# Patient Record
Sex: Female | Born: 1990 | Race: Black or African American | Hispanic: No | Marital: Married | Smoking: Never smoker
Health system: Western US, Academic
[De-identification: ages and names within clinical notes are randomized; demographics above are authoritative.]

## PROBLEM LIST (undated history)

## (undated) DIAGNOSIS — A6000 Herpesviral infection of urogenital system, unspecified: Secondary | ICD-10-CM

## (undated) DIAGNOSIS — N390 Urinary tract infection, site not specified: Secondary | ICD-10-CM

## (undated) DIAGNOSIS — O99012 Anemia complicating pregnancy, second trimester: Secondary | ICD-10-CM

## (undated) HISTORY — DX: Urinary tract infection, site not specified: N39.0

## (undated) HISTORY — DX: Anemia complicating pregnancy, second trimester: O99.012

## (undated) HISTORY — DX: Herpesviral infection of urogenital system, unspecified: A60.00

## (undated) MED ORDER — VALACYCLOVIR HCL 500 MG OR TABS
ORAL_TABLET | ORAL | 1 refills | Status: AC
Start: 2019-03-26 — End: ?

---

## 2015-09-07 HISTORY — PX: OTHER PROCEDURE: U1053

## 2017-09-15 ENCOUNTER — Encounter (INDEPENDENT_AMBULATORY_CARE_PROVIDER_SITE_OTHER): Payer: Self-pay | Admitting: Nurse Practitioner

## 2017-09-15 ENCOUNTER — Other Ambulatory Visit: Payer: TRICARE Prime—HMO | Attending: Nurse Practitioner | Admitting: Nurse Practitioner

## 2017-09-15 VITALS — BP 115/83 | HR 91 | Temp 98.1°F | Resp 16 | Ht 64.0 in | Wt 180.0 lb

## 2017-09-15 DIAGNOSIS — N949 Unspecified condition associated with female genital organs and menstrual cycle: Secondary | ICD-10-CM | POA: Insufficient documentation

## 2017-09-15 DIAGNOSIS — A6000 Herpesviral infection of urogenital system, unspecified: Secondary | ICD-10-CM | POA: Insufficient documentation

## 2017-09-15 DIAGNOSIS — N898 Other specified noninflammatory disorders of vagina: Secondary | ICD-10-CM | POA: Insufficient documentation

## 2017-09-15 DIAGNOSIS — Z3202 Encounter for pregnancy test, result negative: Secondary | ICD-10-CM

## 2017-09-15 DIAGNOSIS — Z113 Encounter for screening for infections with a predominantly sexual mode of transmission: Secondary | ICD-10-CM | POA: Insufficient documentation

## 2017-09-15 DIAGNOSIS — Z1389 Encounter for screening for other disorder: Secondary | ICD-10-CM

## 2017-09-15 LAB — VAGINOSIS SCREEN
Candida, Vaginal Screen: NEGATIVE
Gardnerella, Vaginal Screen: NEGATIVE
Trichomonas, Vaginal Screen: NEGATIVE

## 2017-09-15 LAB — URINE PREGNANCY TEST POCT: HCG, Urine: NEGATIVE

## 2017-09-15 LAB — HIV-ANTIBODY RAPID TEST, BLOOD: HIV 1/2 Rapid Antibody Test: NONREACTIVE

## 2017-09-15 MED ORDER — VALACYCLOVIR HCL 1000 MG OR TABS
1000.0000 mg | ORAL_TABLET | Freq: Two times a day (BID) | ORAL | 0 refills | Status: AC
Start: 2017-09-15 — End: 2017-09-22

## 2017-09-15 NOTE — Interdisciplinary (Signed)
Venipuncture performed per Sue LushAndrea Osorio's,NP order. Lab requisition verified and 1lavender 1 gold tube(s) appropriately obtained. Two patient identifiers verified prior to venipuncture. Blood drawn from right arm with 23 gauge needle. Patient tolerated lab draw well, with no signs of possible adverse reaction. Patient visually verified label on specimen tube for accuracy with nurse. Lab processed along with culture hsv and gc -documented correctly for send out.

## 2017-09-15 NOTE — Interdisciplinary (Signed)
Name/DOB verified.  Pt roomed, VS, and intake done.   Informed on plan of care and aware for any delay; pt verbalized understanding.   Ambulatory to clinic. Comfort measures offered. Vitals stable: Pain addressed.

## 2017-09-15 NOTE — Progress Notes (Signed)
GWENDELYN Vincent  MRN: [id number]  DOB: [birth year]  PMD: No Pcp, Per Patient        Chief Complaint:   Chief Complaint   Patient presents with    Vaginal Discharge     white chucky discharge x 3 weeks ago       HPI:  Autumn Vincent is a 27 year old female  who presents with:  Mild vag itching.    "cut" on vagina 3d ago after rough sex.  Pain when urine runs across skin but not actual dysuria, urinary frequency or urgency.  No vaginal discharge, abdominal pain, nausea, fever or back pain.  Feels well in general.    LMP 08/30/17       Sexual hx:  monogamous with husband only        Past Medical History:   Diagnosis Date    UTI (urinary tract infection)      Past Surgical History:   Procedure Laterality Date    breast reduction  2017    tummy tuck  2017     Social History     Socioeconomic History    Marital status: Married     Spouse name: Not on file    Number of children: Not on file    Years of education: Not on file    Highest education level: Not on file   Social Needs    Financial resource strain: Not on file    Food insecurity - worry: Not on file    Food insecurity - inability: Not on file    Transportation needs - medical: Not on file    Transportation needs - non-medical: Not on file   Occupational History    Not on file   Tobacco Use    Smoking status: Never Smoker    Smokeless tobacco: Never Used   Substance and Sexual Activity    Alcohol use: Yes     Frequency: 2-3 times a week    Drug use: No    Sexual activity: Yes     Partners: Male   Other Topics Concern    Not on file   Social History Narrative    Not on file     Family History   Problem Relation Name Age of Onset    No Known Problems Mother      No Known Problems Father      Hypertension Maternal Grandmother      Cancer Neg Hx      Diabetes Neg Hx      Psychiatry Neg Hx      Heart Disease Neg Hx      Stroke Neg Hx      No Known Cancer Neg Hx      No Known Heart Disease Neg Hx      No Known Diabetes Neg Hx      Thyroid  Neg Hx       Current medications:  None    Review of Systems   Constitutional: No fever, chills, fatigue  ENT:  No sore throat or oral lesions  Gastrointestinal: No abdominal pain, n/v  Genitourinary: See HPI.  No dysuria, urgency, frequency, or flank pain.  Musculoskeletal: No acute muscle or joint pains.  Skin:  No rash or new lesions.  Neurological: No h/a, dizziness  Hematologic/lymphatic:  No swollen or painful LN       Physical Examination:    09/15/17  0953 09/15/17  1013   BP: (!) 125/91 115/83   Pulse:  89 91   Resp: 16    Temp: 98.1 F (36.7 C)    SpO2: 100%      Constitutional:  Well appearing, no distress  Mouth: OP clear, no mucosal lesions  Lungs clear.     Cardiovascular:  RRR   Abdominal:  ND, soft and non-tender.   Back:  No CVAT.     Neuro:  Alert, normal mentation.  Skin: Warm, dry.  Pelvic exam:    Several very small tender ulcerations of introitus.  Scant vaginal d/c.  Cervix closed and without d/c or lesions.  No CMT.  No adnexal tend. or mass.          Diagnostics (if performed):    UPT negative      Impression/Plan:   Genital HSV verses superficial friction avulsions.   Not pregnant.  Will treat presumptively for HSV and culture as well as all other STD screening labs.    -labs as below  -Rx:  7 day course valacyclovir  -Safe sex  -Primary care f/u  -Return precautions      ICD-10-CM ICD-9-CM    1. Vaginal discharge N89.8 623.5 URINE PREGNANCY TEST POCT      Chlamydia/GC PCR, Genital Cobas PCR collection swab      Vaginosis Screen BD Affirm Collection System   2. Vaginal lesion N89.8 623.8 Herpes/Varicella Culture Viral Transport Media Vagina      valACYclovir (VALTREX) 1 GM tablet      HIV-Antibody Rapid Test, Blood Lavender & Yellow Serum Seperator Tube      Syphilis Screen, Blood Yellow serum separator tube   3. Screening examination for venereal disease Z11.3 V74.5 HIV-Antibody Rapid Test, Blood Lavender & Yellow Serum Seperator Tube      Syphilis Screen, Blood Yellow serum separator tube    4. Genital lesion, female N94.9 59629.89 HIV-Antibody Rapid Test, Blood Lavender & Yellow Serum Seperator Tube      Syphilis Screen, Blood Yellow serum separator tube

## 2017-09-15 NOTE — Interdisciplinary (Signed)
Specimens 1 gold sst, 1 lavender, 1 gc swab, 1 viral swab, 1 vaginosis affirm.   Odered at: 12:04pm on: 09/15/2017  Order # [id number]  Picked up by [employer] at 12:34pm on 09/15/2017.   Courier #[id number] , Initails: MR

## 2017-09-15 NOTE — Patient Instructions (Signed)
Sexually Transmitted Disease     You have been diagnosed with or are suspected of having a sexually transmitted disease (STD).     The most common STDs are:  · Gonorrhea. This is also called the "Clap." It is a bacterial infection passed on by sexual intercourse. It may cause a lot of discharge (fluid). It may make urination (peeing) painful. In women, it can cause a lot of vaginal/pelvic pain. It may cause Pelvic Inflammatory Disease (PID). This is a painful condition in women. It can cause the fallopian tubes, which are the tubes that connect the uterus to the ovaries, to scar. It can also cause chronic (ongoing) pelvic pain. It can also cause infertility (the inability to get pregnant). It is treated with antibiotics.  · Chlamydia, also a bacterial infection, may cause a wide range of symptoms. These symptoms range from none to severe pelvic pain and discharge in women and PID, a painful condition in women that can result in scarring of the fallopian tubes (the tubes that connect the uterus to the ovaries). Other symptoms are chronic pelvic pain and infertility (inability to get pregnant). It is treated with antibiotics.  · Trichomoniasis is a protozoan infection that is also called "Trich." It is treated with a special antibiotic. It causes irritating vaginal discharge in women and discharge and penis discomfort in men.  · Herpes Simplex (or just Herpes) is caused by a very infectious virus. It causes painful blisters on the genitals. These tend to burst and scab over. There is no cure for this, but there are medicines to help with symptoms.  · Syphilis is a caused by bacteria. It leaves a painless spot on the penis or vagina. Over time, it may cause a variety of rashes, aches or joint pains. It can also cause fever (temperature higher than 100.4ºF / 38ºC) or other symptoms like neurological injury. Testing is more involved. The health care provider giving the follow-up care after your first visit often does the  tests.  · Hepatitis (Hepatitis B or "Hep B") is a virus. It is passed through sex or sharing dirty needles when using IV drugs. Hep B can affect your liver. Special blood tests are needed for diagnosis. There is no cure. However, there is a vaccine to prevent infection.  · Human Immunodeficiency Virus is the virus that causes AIDS. It is also called "HIV." This disease weakens the natural immune system. It makes people likely to get all types of infection. People get it by being exposed to someone else's infected body fluids during sex. They also get it by sharing needles contaminated with someone else's blood when using IV drugs. People can also get it during a blood transfusion. There are various treatments to stop the progression of this disease. However, there is no cure yet.     We have tested for some of these STDs during your visit today. Your follow-up care will require more testing. The medical staff will inform you of the tests that have been done and where and how to get any more tests that are needed.     The results for most of these tests take several days and are often not ready at the time of your first visit.     The doctor may decide to start treatment for certain diseases right away before all tests results are back.     If the doctor decides to wait for test results before starting treatment, the medical staff will contact you when the test results are ready.  ·   If you have not already given your phone or email contact information to the medical staff, be sure to do so before leaving today.     All efforts will be made to maintain privacy and confidentiality in these matters.     If an STD is suspected or certain, tell your sexual partner(s) of the need to be checked by a doctor or local health department.     Protect yourself and your sexual partner from STDs. Use a condom during intercourse (sex), especially if your diagnosis is unsure and you are waiting for results from pelvic cultures done  during your visit today.     It is OK to go home now.     You may need to return here or go to the nearest Emergency Department if symptoms that might signal complications develop. These include: Worsening infection, bleeding or other problems.     Follow up with the gynecologist or regular doctor in the next few days. Inform your doctor of this visit.  · If you don't have the right follow-up care available, tell the medical staff before you go. The staff can help make arrangements for you.     YOU SHOULD SEEK MEDICAL ATTENTION IMMEDIATELY, EITHER HERE OR AT THE NEAREST EMERGENCY DEPARTMENT, IF ANY OF THE FOLLOWING OCCURS:  · You have worse pain in the abdomen (belly), pelvis or back.  · Increasing amounts of vaginal discharge or bleeding (more than one pad per hour), passing large blood clots (women), or increasing testicular pain and swelling (men).  · Fever (temperature higher than 100.4ºF / 38ºC), chills, nausea (sick to your stomach) or vomiting (throwing up).  · You feel dizzy, lightheaded or pass out.           If you have any follow up questions regarding your Express Care Visit today including symptoms, medication concerns please contact your PCP office who will follow up with you.

## 2017-09-15 NOTE — Interdisciplinary (Signed)
Patient Discharge/Education  AVS printed/explained to pt: Yes  Learner: Patient  Method: Explanation and Handout  Treatment education given: Yes  Response: Verbalizes understanding  Stable upon discharge: Yes  Pleased w/ service and informed of delay: Yes

## 2017-09-16 ENCOUNTER — Telehealth (INDEPENDENT_AMBULATORY_CARE_PROVIDER_SITE_OTHER): Payer: Self-pay | Admitting: Nurse Practitioner

## 2017-09-16 LAB — CHLAMYDIA/GONORRHEA PCR, GENITAL
Chlamydia trachomatis PCR: NOT DETECTED
Neisseria gonorrhoeae PCR: NOT DETECTED

## 2017-09-16 LAB — SYPHILIS EIA SCREEN, BLOOD: Syphilis EIA Screen: NEGATIVE

## 2017-09-16 NOTE — Telephone Encounter (Signed)
Pt called to get test results  HIV, vaginosis screen negative.  Herpes culture, GC chlamydia pending.

## 2017-09-16 NOTE — Telephone Encounter (Signed)
PT called for test results. Please call PT back.

## 2017-09-17 ENCOUNTER — Telehealth (INDEPENDENT_AMBULATORY_CARE_PROVIDER_SITE_OTHER): Payer: Self-pay | Admitting: Occupational Health

## 2017-09-17 NOTE — Progress Notes (Signed)
Patient called and informed of lab results. Herpes culture shows no growth to date. Patient to f/u with PCP on 10/04/17

## 2017-09-17 NOTE — Telephone Encounter (Signed)
See telephone note.

## 2017-09-19 ENCOUNTER — Telehealth (INDEPENDENT_AMBULATORY_CARE_PROVIDER_SITE_OTHER): Payer: Self-pay | Admitting: Nurse Practitioner

## 2017-09-19 NOTE — Telephone Encounter (Signed)
Returned call to pt but no answer and no vm option.

## 2017-09-19 NOTE — Telephone Encounter (Signed)
Patient called for the rest of her results.

## 2017-09-20 ENCOUNTER — Telehealth (INDEPENDENT_AMBULATORY_CARE_PROVIDER_SITE_OTHER): Payer: Self-pay | Admitting: Nurse Practitioner

## 2017-09-20 NOTE — Telephone Encounter (Signed)
Patient called to request her lab results.

## 2017-09-21 ENCOUNTER — Telehealth (INDEPENDENT_AMBULATORY_CARE_PROVIDER_SITE_OTHER): Payer: Self-pay | Admitting: Nurse Practitioner

## 2017-09-21 DIAGNOSIS — A609 Anogenital herpesviral infection, unspecified: Principal | ICD-10-CM

## 2017-09-21 DIAGNOSIS — N898 Other specified noninflammatory disorders of vagina: Secondary | ICD-10-CM

## 2017-09-21 LAB — HERPES/VARICELLA CULTURE

## 2017-09-21 MED ORDER — VALACYCLOVIR HCL 1000 MG OR TABS
1000.0000 mg | ORAL_TABLET | Freq: Two times a day (BID) | ORAL | 1 refills | Status: DC
Start: 2017-09-21 — End: 2018-02-13

## 2017-09-21 NOTE — Telephone Encounter (Signed)
Spoke with pt by phone confirmed (+) hsv, sxs resolved with valtrex, gave refill of same and pt ed on condition/transmission reduction etc f/u prn sxs recur frequently or new sxs develop

## 2017-10-04 ENCOUNTER — Encounter (INDEPENDENT_AMBULATORY_CARE_PROVIDER_SITE_OTHER): Payer: TRICARE Prime—HMO | Admitting: Family Medicine

## 2018-01-03 ENCOUNTER — Encounter (INDEPENDENT_AMBULATORY_CARE_PROVIDER_SITE_OTHER): Payer: Self-pay | Admitting: Family

## 2018-01-03 ENCOUNTER — Ambulatory Visit (INDEPENDENT_AMBULATORY_CARE_PROVIDER_SITE_OTHER): Payer: TRICARE Prime—HMO | Admitting: Family

## 2018-01-03 VITALS — BP 114/71 | HR 62 | Temp 98.1°F | Resp 16 | Ht 64.0 in | Wt 180.0 lb

## 2018-01-03 DIAGNOSIS — Z760 Encounter for issue of repeat prescription: Secondary | ICD-10-CM

## 2018-01-03 MED ORDER — VALACYCLOVIR HCL 500 MG OR TABS
500.0000 mg | ORAL_TABLET | Freq: Two times a day (BID) | ORAL | 2 refills | Status: AC
Start: 2018-01-03 — End: 2018-01-06

## 2018-01-03 NOTE — Interdisciplinary (Signed)
9:30 Name/DOB verified.  Pt roomed, VS, and intake done.   Informed on plan of care and aware for any delay; pt verbalized understanding.   Ambulatory to clinic. Comfort measures offered. Vitals stable: Pain addressed.      9:42 Patient Discharge/Education  AVS printed/explained to pt: Yes  Learner: Patient  Method: Explanation with highlights and Handout  Treatment education given: Yes  Response: Verbalizes understanding  Stable upon discharge: Yes and directed/walked to exit   Pleased w/ service and informed of delay: Yes no complaints

## 2018-01-03 NOTE — Patient Instructions (Addendum)
Herpes Genitalis    Herpes simplex is a common contagious infection caused by a virus. Herpes can infect the mouth and face or the genital area. Genital herpes is a sexually transmitted infection. It passes from person to person during vaginal or anal intercourse (sex). It can also be transmitted during oral sex. Once infected with herpes, you will always have herpes (but may not show symptoms). Herpes sometimes causes painful sores that look like small blisters. Herpes often spreads when someone with these sores has intimate contact (like kissing or sex) with someone without herpes. You are most contagious when you have open sores, but you can still spread it even if you do not have any sores or other symptoms.    Herpes sores develop 2-30 days after exposure to the herpes virus. Other symptoms are tender lymph nodes (lumps in the groin). Sometimes there is fever (temperature higher than 100.39F / 38C), headache or muscle aches. Symptoms usually last 1-2 weeks and get better without treatment. If you get treatment soon enough, symptoms may go away faster.    Antiviral and pain medicines are used in treatment. If used early enough in the course of a herpes outbreak, medicines can shorten the course of the infection and lessen the amount of pain.    Do not have sex until the lesions (sores) fully heal. Use condoms with sex to protect your partner. Even if your herpes rash clears up, you can still be contagious. This means you can spread herpes through sex. If you partner has any symptoms, he or she should see a doctor right away.    YOU SHOULD SEEK MEDICAL ATTENTION IMMEDIATELY, EITHER HERE OR AT THE NEAREST EMERGENCY DEPARTMENT, IF ANY OF THE FOLLOWING OCCURS:   Headache, stiff neck, confusion or light sensitivity develops.   Lesions get more red or painful or look infected. Some signs of infection are redness, swelling, fever (temperature higher than 100.39F / 38C), pain and/or pus drainage.   There is so  much swelling and pain that it is hard to urinate (pee).   For any other new symptoms or concerns.     If you have any follow up questions regarding your Express Care Visit today including symptoms, medication concerns please contact your primary care physician's office who will follow up with you

## 2018-01-03 NOTE — Progress Notes (Signed)
Chief Complaint:   Chief Complaint   Patient presents with    Medication Refill     valtrex         HPI:  Autumn Vincent is a 27 year old female  who presents to clinic for medication refill for herpes.  Patient was diagnosed with genital herpes last January 20 after herpes culture.  Patient has been using Valtrex for flare ups.    Patient states that she would take it for 7 days then will have another flare up after 1 or 2 days.  Patient reports external vaginal itching as usual her symptoms before her flare.  Reports white vaginal discharge that is clumping and not her usual vaginal discharge.  Denies dysuria, urinary frequency, urgency.  She is currently sexually active.  Her 1st primary care provider appointment is on February 09, 2018.    HCM:    FLU - declined today  TDAP - unknown, gave birth 2016.  HPV - unknown  PAP smears - 2016 - normal    Past Medical History:   Diagnosis Date    Genital herpes     UTI (urinary tract infection)      Past Surgical History:   Procedure Laterality Date    breast reduction  2017    tummy tuck  2017     Social History     Socioeconomic History    Marital status: Married     Spouse name: Not on file    Number of children: Not on file    Years of education: Not on file    Highest education level: Not on file   Occupational History    Not on file   Social Needs    Financial resource strain: Not on file    Food insecurity:     Worry: Not on file     Inability: Not on file    Transportation needs:     Medical: Not on file     Non-medical: Not on file   Tobacco Use    Smoking status: Never Smoker    Smokeless tobacco: Never Used   Substance and Sexual Activity    Alcohol use: Yes     Frequency: 2-3 times a week    Drug use: No    Sexual activity: Yes     Partners: Male   Lifestyle    Physical activity:     Days per week: Not on file     Minutes per session: Not on file    Stress: Not on file   Relationships    Social connections:     Talks on phone: Not on file      Gets together: Not on file     Attends religious service: Not on file     Active member of club or organization: Not on file     Attends meetings of clubs or organizations: Not on file     Relationship status: Not on file    Intimate partner violence:     Fear of current or ex partner: Not on file     Emotionally abused: Not on file     Physically abused: Not on file     Forced sexual activity: Not on file   Other Topics Concern    Not on file   Social History Narrative    Not on file     Family History   Problem Relation Name Age of Onset    No Known Problems Mother  No Known Problems Father      Hypertension Maternal Grandmother      Cancer Neg Hx      Diabetes Neg Hx      Psychiatry Neg Hx      Heart Disease Neg Hx      Stroke Neg Hx      No Known Cancer Neg Hx      No Known Heart Disease Neg Hx      No Known Diabetes Neg Hx      Thyroid Neg Hx       Medication Review Moment  Cannot display prior to admission medications because the patient has not been admitted in this contact.       Review of Systems -ROS: Except as documented above, all other systems were reviewed and were negative    Physical Examination:    01/03/18  0700   BP: 114/71   Pulse: 62   Resp: 16   Temp: 98.1 F (36.7 C)   SpO2: 99%     Vital Signs noted from Triage Page.  Autumn Vincent is a well developed well nourished female in no apparent distress. she is alert and oriented x3.   HEENT exam shows:  Eyes PERRL EOMI.    Abdominal exam shows that abdomen is soft and non-tender. No masses or rebound can be palpated.  Back exam has no costovertebral angle tenderness.   Stable gait.   Skin exam shows no lesions or rashes.  Declined pelvic exam   Impression:     27 year old   Female presenting with external vaginal itching requesting Valtrex refill for genital herpes flare up.  Patient reports this is for symptoms should have before flare up.  Has white abnormal vaginal discharge.  Denies UTI symptoms.    Declined visual  inspection external genitalia and declined pelvic exam today.  Patient states she does not have any yeast infection and just wants to have Valtrex refilled.      ICD-10-CM ICD-9-CM    1. Medication refill Z76.0 V68.1 valACYclovir (VALTREX) 500 MG tablet     Plan:    Refilled valtrex 500 mg BID x 3 days, #6, 2 refills.  Pt aware to avoid sex when having a flare up.  May discuss suppressive therapy with PCP.    Return precautions given  Patient to arrange follow up.  Patient comfortable with plan.     RX sent via e-prescription

## 2018-02-07 ENCOUNTER — Ambulatory Visit (INDEPENDENT_AMBULATORY_CARE_PROVIDER_SITE_OTHER): Payer: TRICARE Prime—HMO | Admitting: Family

## 2018-02-07 ENCOUNTER — Encounter (INDEPENDENT_AMBULATORY_CARE_PROVIDER_SITE_OTHER): Payer: Self-pay | Admitting: Family

## 2018-02-07 ENCOUNTER — Telehealth (INDEPENDENT_AMBULATORY_CARE_PROVIDER_SITE_OTHER): Payer: Self-pay | Admitting: Internal Medicine

## 2018-02-07 VITALS — BP 122/85 | HR 88 | Temp 98.2°F | Resp 14 | Ht 64.0 in | Wt 180.0 lb

## 2018-02-07 DIAGNOSIS — Z760 Encounter for issue of repeat prescription: Secondary | ICD-10-CM

## 2018-02-07 MED ORDER — VALACYCLOVIR HCL 500 MG OR TABS
500.00 mg | ORAL_TABLET | Freq: Two times a day (BID) | ORAL | 0 refills | Status: AC
Start: 2018-02-07 — End: 2018-02-10

## 2018-02-07 NOTE — Patient Instructions (Signed)
Herpes Genitalis    Herpes simplex is a common contagious infection caused by a virus. Herpes can infect the mouth and face or the genital area. Genital herpes is a sexually transmitted infection. It passes from person to person during vaginal or anal intercourse (sex). It can also be transmitted during oral sex. Once infected with herpes, you will always have herpes (but may not show symptoms). Herpes sometimes causes painful sores that look like small blisters. Herpes often spreads when someone with these sores has intimate contact (like kissing or sex) with someone without herpes. You are most contagious when you have open sores, but you can still spread it even if you do not have any sores or other symptoms.     Herpes sores develop 2-30 days after exposure to the herpes virus. Other symptoms are tender lymph nodes (lumps in the groin). Sometimes there is fever (temperature higher than 100.4°F / 38°C), headache or muscle aches. Symptoms usually last 1-2 weeks and get better without treatment. If you get treatment soon enough, symptoms may go away faster.     Antiviral and pain medicines are used in treatment. If used early enough in the course of a herpes outbreak, medicines can shorten the course of the infection and lessen the amount of pain.     Do not have sex until the lesions (sores) fully heal. Use condoms with sex to protect your partner. Even if your herpes rash clears up, you can still be contagious. This means you can spread herpes through sex. If you partner has any symptoms, he or she should see a doctor right away.     YOU SHOULD SEEK MEDICAL ATTENTION IMMEDIATELY, EITHER HERE OR AT THE NEAREST EMERGENCY DEPARTMENT, IF ANY OF THE FOLLOWING OCCURS:  · Headache, stiff neck, confusion or light sensitivity develops.  · Lesions get more red or painful or look infected. Some signs of infection are redness, swelling, fever (temperature higher than 100.4°F / 38°C), pain and/or pus drainage.  · There is so  much swelling and pain that it is hard to urinate (pee).  · For any other new symptoms or concerns.

## 2018-02-07 NOTE — Progress Notes (Signed)
Chief Complaint:   Chief Complaint   Patient presents with    Medication Refill     anti-viral medication        HPI:  Autumn Vincent is a [age] year old female  who [facility].    She was diagnosed with herpes genitalis for the first time last January 10 during express care visit.  Pt reports having flare ups every week.    Last seen for med refill April 30. She used Valtrex and lesions would clear up in 3 days. She had 2 recurrence in May. Last dose was May 27-29.    She will be going out of town this week and is worried about having another flare up without any medication.  Denies kidney problems.  She has [facility] to establish care.    Past Medical History:   Diagnosis Date    Genital herpes     UTI (urinary tract infection)      Past Surgical History:   Procedure Laterality Date    breast reduction  2017    tummy tuck  2017     Social History     Socioeconomic History    Marital status: Married     Spouse name: Not on file    Number of children: Not on file    Years of education: Not on file    Highest education level: Not on file   Occupational History    Not on file   Social Needs    Financial resource strain: Not on file    Food insecurity:     Worry: Not on file     Inability: Not on file    Transportation needs:     Medical: Not on file     Non-medical: Not on file   Tobacco Use    Smoking status: Never Smoker    Smokeless tobacco: Never Used   Substance and Sexual Activity    Alcohol use: Yes     Frequency: 2-3 times a week    Drug use: No    Sexual activity: Yes     Partners: Male   Lifestyle    Physical activity:     Days per week: Not on file     Minutes per session: Not on file    Stress: Not on file   Relationships    Social connections:     Talks on phone: Not on file     Gets together: Not on file     Attends religious service: Not on file     Active member of club or organization: Not on file     Attends meetings of  clubs or organizations: Not on file     Relationship status: Not on file    Intimate partner violence:     Fear of current or ex partner: Not on file     Emotionally abused: Not on file     Physically abused: Not on file     Forced sexual activity: Not on file   Other Topics Concern    Not on file   Social History Narrative    Not on file     Family History   Problem Relation Name Age of Onset    No Known Problems Mother      No Known Problems Father      Hypertension Maternal Grandmother      Cancer Neg Hx  Diabetes Neg Hx      Psychiatry Neg Hx      Heart Disease Neg Hx      Stroke Neg Hx      No Known Cancer Neg Hx      No Known Heart Disease Neg Hx      No Known Diabetes Neg Hx      Thyroid Neg Hx       Medication Review Moment  Cannot display prior to admission medications because the patient has not been admitted in this contact.       Review of Systems -ROS: Except as documented above, all other systems were reviewed and were negative    Physical Examination:    02/07/18  1500   BP: 122/85   Pulse: 88   Resp: 14   Temp: 98.2 F (36.8 C)   SpO2: 100%     Vital Signs noted from Triage Page.  Autumn Vincent is a well developed well nourished female in no apparent distress. she is alert and oriented x3.   HEENT exam shows:Marland Kitchen   Eyes PERRL EOMI.    Extremity exam shows no cyanosis, clubbing or edema  Stable gait.     Impression:     27 year old   female here  for medication refill due to frequent herpes flare ups. Currently has no lesions.      ICD-10-CM ICD-9-CM    1. Medication refill Z76.0 V68.1 valACYclovir (VALTREX) 500 MG tablet     Plan:    Prescribed Valtrex 500 mg 2x/day for 3 days for recurrence. #6, no refills.  Return precautions given  To follow up with PCP on Monday.     RX sent via e-prescription

## 2018-02-07 NOTE — Telephone Encounter (Signed)
New Patient Pre-Visit Planning    Future Appointments   Date Time Provider Department Center   02/13/2018 11:00 AM Iva LentoBach, Clark T, MD [facility]       Mychart offered: Yes      Agenda Setting:    1) overall check up   2) est care    Preferred Pharmacy:     Send to:     [id number]- [place], [place] - [address] [facility] [address] [facility] [address]  [address]  [place] [id number]  Phone: [phone] Fax: [phone]        Previous Medical Provider/Facility:    Name/Facility: n/a   Street Address: n/a   Phone number: n/a   Fax number: n/a

## 2018-02-08 NOTE — Telephone Encounter (Signed)
Information entered in chart and in future note

## 2018-02-09 ENCOUNTER — Ambulatory Visit (INDEPENDENT_AMBULATORY_CARE_PROVIDER_SITE_OTHER): Payer: TRICARE Prime—HMO | Admitting: Internal Medicine

## 2018-02-13 ENCOUNTER — Ambulatory Visit (INDEPENDENT_AMBULATORY_CARE_PROVIDER_SITE_OTHER): Payer: TRICARE Prime—HMO | Admitting: Internal Medicine

## 2018-02-13 ENCOUNTER — Encounter (INDEPENDENT_AMBULATORY_CARE_PROVIDER_SITE_OTHER): Payer: Self-pay

## 2018-02-13 ENCOUNTER — Encounter

## 2018-02-13 ENCOUNTER — Encounter (INDEPENDENT_AMBULATORY_CARE_PROVIDER_SITE_OTHER): Payer: Self-pay | Admitting: Internal Medicine

## 2018-02-13 ENCOUNTER — Other Ambulatory Visit: Payer: Self-pay

## 2018-02-13 VITALS — BP 116/74 | HR 88 | Temp 98.4°F | Ht 63.5 in | Wt 182.5 lb

## 2018-02-13 DIAGNOSIS — O99012 Anemia complicating pregnancy, second trimester: Secondary | ICD-10-CM | POA: Insufficient documentation

## 2018-02-13 DIAGNOSIS — Z1389 Encounter for screening for other disorder: Secondary | ICD-10-CM

## 2018-02-13 DIAGNOSIS — Z7689 Persons encountering health services in other specified circumstances: Secondary | ICD-10-CM

## 2018-02-13 DIAGNOSIS — Z8619 Personal history of other infectious and parasitic diseases: Secondary | ICD-10-CM

## 2018-02-13 DIAGNOSIS — Z1339 Encounter for screening examination for other mental health and behavioral disorders: Secondary | ICD-10-CM

## 2018-02-13 DIAGNOSIS — Z1331 Encounter for screening for depression: Secondary | ICD-10-CM

## 2018-02-13 HISTORY — DX: Anemia complicating pregnancy, second trimester: O99.012

## 2018-02-13 MED ORDER — VALACYCLOVIR HCL 500 MG OR TABS
ORAL_TABLET | ORAL | Status: DC
Start: 2018-01-03 — End: 2018-02-13

## 2018-02-13 MED ORDER — CEPHALEXIN 500 MG OR CAPS
ORAL_CAPSULE | ORAL | Status: DC
Start: 2016-12-27 — End: 2018-02-13

## 2018-02-13 MED ORDER — PROMETHAZINE HCL 25 MG RE SUPP
RECTAL | Status: DC
Start: 2016-12-27 — End: 2018-02-13

## 2018-02-13 MED ORDER — VALACYCLOVIR HCL 500 MG OR TABS
500.00 mg | ORAL_TABLET | Freq: Every day | ORAL | 1 refills | Status: DC
Start: 2018-02-13 — End: 2018-04-13

## 2018-02-13 MED ORDER — VALACYCLOVIR HCL 500 MG OR TABS
ORAL_TABLET | ORAL | 0 refills | Status: DC
Start: 2018-02-10 — End: 2018-02-13

## 2018-02-13 NOTE — Patient Instructions (Addendum)
1. Any new itching or rash, take pic or come in, take antiviral med    HPV (Human Papillomavirus) Vaccine: What You Need to Know  1. Why get vaccinated?  HPV vaccine prevents infection with human papillomavirus (HPV) types that are associated with many cancers, including:   cervical cancer in females,   vaginal and vulvar cancers in females,   anal cancer in females and males,   throat cancer in females and males, and   penile cancer in males.    In addition, HPV vaccine prevents infection with HPV types that cause genital warts in both females and males.  In the [place] about 12,000 women get cervical cancer every year, and about 4,000 women die from it. HPV vaccine can prevent most of these cases of cervical cancer.  Vaccination is not a substitute for cervical cancer screening. This vaccine does not protect against all HPV types that can cause cervical cancer. Women should still get regular Pap tests.  HPV infection usually comes from sexual contact, and most people will become infected at some point in their life. About 14 million Americans, including teens, get infected every year. Most infections will go away on their own and not cause serious problems. But thousands of women and men get cancer and other diseases from HPV.  2. HPV vaccine  HPV vaccine is approved by FDA and is recommended by CDC for both males and females. It is routinely given at 7111 or 25 years of age, but it may be given beginning at age 219 years through age 25 years.  Most adolescents 9 through 25 years of age should get HPV vaccine as a two-dose series with the doses separated by 6-12 months. People who start HPV vaccination at 25 years of age and older should get the vaccine as a three-dose series with the second dose given 1-2 months after the first dose and the third dose given 6 months after the first dose. There are several exceptions to these age recommendations. Your health care provider can give you more information.  3. Some  people should not get this vaccine   Anyone who has had a severe (life-threatening) allergic reaction to a dose of HPV vaccine should not get another dose.   Anyone who has a severe (life threatening) allergy to any component of HPV vaccine should not get the vaccine.   Tell your doctor if you have any severe allergies that you know of, including a severe allergy to yeast.   HPV vaccine is not recommended for pregnant women. If you learn that you were pregnant when you were vaccinated, there is no reason to expect any problems for you or your baby. Any woman who learns she was pregnant when she got HPV vaccine is encouraged to contact the manufacturer's registry for HPV vaccination during pregnancy at [phone]. Women who are breastfeeding may be vaccinated.   If you have a mild illness, such as a cold, you can probably get the vaccine today. If you are moderately or severely ill, you should probably wait until you recover. Your doctor can advise you.  4. Risks of a vaccine reaction  With any medicine, including vaccines, there is a chance of side effects. These are usually mild and go away on their own, but serious reactions are also possible.  Most people who get HPV vaccine do not have any serious problems with it.  Mild or moderate problems following HPV vaccine:   Reactions in the arm where the shot was  given:  ? Soreness (about 9 people in 10)  ? Redness or swelling (about 1 person in 3)   Fever:  ? Mild (100F) (about 1 person in 10)  ? Moderate (102F) (about 1 person in 3)   Other problems:  ? Headache (about 1 person in 3)  Problems that could happen after any injected vaccine:   People sometimes faint after a medical procedure, including vaccination. Sitting or lying down for about 15 minutes can help prevent fainting, and injuries caused by a fall. Tell your doctor if you feel dizzy, or have vision changes or ringing in the ears.   Some people get severe pain in the shoulder and have  difficulty moving the arm where a shot was given. This happens very rarely.   Any medication can cause a severe allergic reaction. Such reactions from a vaccine are very rare, estimated at about 1 in a million doses, and would happen within a few minutes to a few hours after the vaccination.  As with any medicine, there is a very remote chance of a vaccine causing a serious injury or death.  The safety of vaccines is always being monitored. For more information, visit: http://floyd.org/.  5. What if there is a serious reaction?  What should I look for?  Look for anything that concerns you, such as signs of a severe allergic reaction, very high fever, or unusual behavior.  Signs of a severe allergic reaction can include hives, swelling of the face and throat, difficulty breathing, a fast heartbeat, dizziness, and weakness. These would usually start a few minutes to a few hours after the vaccination.  What should I do?  If you think it is a severe allergic reaction or other emergency that can't wait, call 9-1-1 or get to the nearest hospital. Otherwise, call your doctor.  Afterward, the reaction should be reported to the Vaccine Adverse Event Reporting System (VAERS). Your doctor should file this report, or you can do it yourself through the VAERS web site at www.vaers.LAgents.no, or by calling 1-416-654-9409.  VAERS does not give medical advice.  6. The National Vaccine Injury Compensation Program  The [employer] Vaccine Injury Compensation Program (VICP) is a federal program that was created to compensate people who may have been injured by certain vaccines.  Persons who believe they may have been injured by a vaccine can learn about the program and about filing a claim by calling 1-484-103-7144 or visiting the VICP website at SpiritualWord.at. There is a time limit to file a claim for compensation.  7. How can I learn more?   Ask your health care provider. He or she can give you the vaccine  package insert or suggest other sources of information.   Call your local or state health department.   Contact the Centers for Disease Control and Prevention (CDC):  ? Call (908)299-3860 (1-800-CDC-INFO) or  ? Visit CDCs website at RunningConvention.de  Vaccine Information Statement, HPV Vaccine (08/08/2015)  This information is not intended to replace advice given to you by your health care provider. Make sure you discuss any questions you have with your health care provider.  Document Released: 03/20/2014 Document Revised: 05/13/2016 Document Reviewed: 05/13/2016  Elsevier Interactive Patient Education  2017 [employer].

## 2018-02-13 NOTE — Progress Notes (Signed)
CC:   Chief Complaint   Patient presents with   • Establish Care   • Refill Request       Subjective:    Autumn Vincent is a 27 year old female   who presents for Establish Care and Refill Request     27yo F Jan 2019 dx genital herpes and was rx valacyclovir intermittently, was having sx every week now 2x a mo, has taken valacyclovir that's been effective w in 3d. Her first episode had blisters, but since then her episodes are not painful but itchy and Labia swollen, but she doesn't see vesicles when taking valacyclovir. No current active lesions. Her LMP was may 17.    Pt admit to used to drink 3-4x a mo, last drink was on vacation on sat, but doesn't think she has drinking problem, and is declining counseling.      Patient Active Problem List   Diagnosis   • Anemia complicating pregnancy, second trimester       Review of Systems   Constitutional: Negative for chills, diaphoresis, fever, malaise/fatigue and weight loss.   HENT: Negative for congestion, ear discharge, ear pain, hearing loss, nosebleeds, sinus pain, sore throat and tinnitus.    Eyes: Negative for blurred vision, double vision, photophobia, pain, discharge and redness.   Respiratory: Negative for cough, hemoptysis, sputum production, shortness of breath, wheezing and stridor.    Cardiovascular: Negative for chest pain, palpitations, orthopnea, claudication, leg swelling and PND.   Gastrointestinal: Negative for abdominal pain, blood in stool, constipation, diarrhea, heartburn, melena, nausea and vomiting.   Genitourinary: Negative for dysuria, flank pain, frequency, hematuria and urgency.   Musculoskeletal: Negative for back pain, falls, joint pain, myalgias and neck pain.   Skin: Negative for itching and rash.   Neurological: Negative for dizziness, tingling, tremors, sensory change, speech change, focal weakness, seizures, loss of consciousness, weakness and headaches.   Endo/Heme/Allergies: Negative for environmental allergies and  polydipsia. Does not bruise/bleed easily.   Psychiatric/Behavioral: Negative for depression, hallucinations, memory loss, substance abuse and suicidal ideas. The patient is not nervous/anxious and does not have insomnia.          Current Outpatient Medications on File Prior to Visit   Medication Sig Dispense Refill   • [DISCONTINUED] cephALEXin (KEFLEX) 500 MG capsule      • [DISCONTINUED] promethazine (PHENERGAN) 25 MG suppository      • [DISCONTINUED] valACYclovir (VALTREX) 1 GM tablet Take 1 tablet (1,000 mg) by mouth 2 times daily. 20 tablet 1   • [DISCONTINUED] valACYclovir (VALTREX) 500 MG tablet      • [DISCONTINUED] valACYclovir (VALTREX) 500 MG tablet   0     No current facility-administered medications on file prior to visit.        No Known Allergies    Patient Active Problem List   Diagnosis   • Anemia complicating pregnancy, second trimester       Past Medical History:   Diagnosis Date   • Anemia complicating pregnancy, second trimester 02/13/2018   • Genital herpes    • UTI (urinary tract infection)        Past Surgical History:   Procedure Laterality Date   • breast reduction  2017   • tummy tuck  2017       Social History     Socioeconomic History   • Marital status: Married     Spouse name: Not on file   • Number of children: Not on file   •   Years of education: Not on file   • Highest education level: Not on file   Occupational History   • Not on file   Social Needs   • Financial resource strain: Not on file   • Food insecurity:     Worry: Not on file     Inability: Not on file   • Transportation needs:     Medical: Not on file     Non-medical: Not on file   Tobacco Use   • Smoking status: Never Smoker   • Smokeless tobacco: Never Used   Substance and Sexual Activity   • Alcohol use: Yes     Frequency: 2-3 times a week   • Drug use: No   • Sexual activity: Yes     Partners: Male   Lifestyle   • Physical activity:     Days per week: Not on file     Minutes per session: Not on file   • Stress: Not on  file   Relationships   • Social connections:     Talks on phone: Not on file     Gets together: Not on file     Attends religious service: Not on file     Active member of club or organization: Not on file     Attends meetings of clubs or organizations: Not on file     Relationship status: Not on file   • Intimate partner violence:     Fear of current or ex partner: Not on file     Emotionally abused: Not on file     Physically abused: Not on file     Forced sexual activity: Not on file   Other Topics Concern   • Not on file   Social History Narrative   • Not on file       Family Status   Relation Status   • Mo Alive   • Fa Alive   • MGMo (Not Specified)   • Neg Hx (Not Specified)         Objective:  BP 116/74 (BP Location: Left arm, BP Patient Position: Sitting, BP cuff size: Large)    Pulse 88    Temp 98.4 °F (36.9 °C) (Oral)    Ht 5' 3.5" (1.613 m)    Wt 82.8 kg (182 lb 8 oz)    LMP 01/20/2018 (Exact Date)    SpO2 99%    BMI 31.82 kg/m²  Body mass index is 31.82 kg/m².      Gen: in NAD, A&O, pleasant, non-toxic appearing  HEENT: NC/AT, No icterus, ptosis. Oropharynx w/out exudates or erythema. Bilateral ear canal and TM's normal.   Neck: Supple, no LAD.  Lungs: clear to auscultation bilaterally. No chest deformities noted.  No wheeze/rales/rhonchi   CV: regular rate & rhythm, nl S1, S2. No murmurs  Abdomen: Soft, + bowel sounds.  Non-tender, non-distended. No masses, organomegaly. No rebound tenderness.  Extremities: No edema.   Neurologic: Mentation appropriate. 5/5 Strength in bilateral upper and lower extremities,sensation intact.   GU: declined PE, denies lesions    Routine health care maintenance were dealt with today. Appropriate screening labs and studies were ordered, and preventative counseling was done.    Health Maintenance   Topic Date Due   • CERVICAL CANCER SCREENING  07/01/2014   • HPV Vaccine <= 26 Yrs (1) 02/17/2018 (Originally 07/01/2004)   • INFLUENZA VACCINE  04/06/2018   • CHLAMYDIA SCREENING   09/15/2018   •   PHQ2 depression screen  02/14/2019   • IMM_TD/TDAP=>11 YO  08/27/2024         Lipids:  No results found for: CHOL, HDL, LDLCALC, TRIG, LDLDIRECT    Diabetes screen:No results found for: A1C  Thyroid:  No results found for: TSH      No results found for: COLORUA, APPEARUA, GLUCOSEUA, BILIUA, KETONEUA, SGUA, BLOODUA, PHUA, PROTEINUA, UROBILUA, NITRITEUA, LEUKESTUA, WBCUA, RBCUA, EPITHCELLSUA, HYALINEUA, CRYSTALSUA, COMMENTSUA    No results found for: WBC, RBC, HGB, HCT, MCV, MCHC, RDW, PLT, MPV  No results found for: BUN, CREAT, CL, NA, K, [place], TBILI, ALB, TP, AST, ALK, BICARB, ALT, GLU        ASSESSMENT:/PLAN:    History of herpes genitalis  - for suppression tx: valACYclovir (VALTREX) 500 MG tablet; Take 1 tablet (500 mg) by mouth daily.  Dispense: 30 tablet; Refill: 1    Screened positive for possible risky alcohol use (AUDIT score of 7-15)  CAGE Questions for Alcohol Use from MDCalc.com  on 02/13/2018    RESULT SUMMARY:  0 points  Screening negative.      INPUTS:  Have you ever felt you needed to Cut down on your drinking? --> 0 = No  Have people Annoyed you by criticizing your drinking? --> 0 = No  Have you ever felt Guilty about drinking? --> 0 = No  Have you ever felt you needed a drink first thing in the morning (Eye-opener) to steady your nerves or to get rid of a hangover? --> 0 = No    - Counseled for alcohol use  - declined further help      Screened negative for depression  Screened negative for drug use    Establishing care with new doctor, encounter for            Problem List Items Addressed This Visit     None      Visit Diagnoses     History of herpes genitalis    -  Primary    Relevant Medications    valACYclovir (VALTREX) 500 MG tablet    Screened positive for possible risky alcohol use (AUDIT score of 7-15)        Relevant Orders    Counseled for alcohol use (Completed)    Screened negative for depression        Screened negative for drug use        Establishing care with new doctor,  encounter for              Follow up    Return in 1 month (on 03/15/2018) for PAP, f/u AUDIT at follow up visit.    No future appointments.        Medication Review:  Medications reviewed with patient and medication list reconciled.  Over the counter medications, herbal therapies and supplements reviewed.  Patient's understanding and response to medications assessed.   Barriers to medications assessed and addressed.   Risks, benefits, alternatives to medications reviewed.      Patient Instruction: See Patient Education Section    No barriers to learning, verbalizes understanding of teaching and instructions.    Clark Bach, MD  [facility]          Reviewed AUDIT score (Total Score: 8) and discussed options with patient and employed shared-decision making in developing follow-up plan.

## 2018-02-27 ENCOUNTER — Encounter (INDEPENDENT_AMBULATORY_CARE_PROVIDER_SITE_OTHER): Payer: Self-pay

## 2018-02-27 ENCOUNTER — Telehealth (INDEPENDENT_AMBULATORY_CARE_PROVIDER_SITE_OTHER): Payer: Self-pay | Admitting: Internal Medicine

## 2018-02-27 NOTE — Telephone Encounter (Signed)
-----   Message from Janalyn Shyyasia S. Muhlbauer sent at 02/26/2018 11:34 AM PDT -----  Regarding: Appointment Request  Contact: [phone]  Appointment Request From: Annia Beltyasia Sierra Gosdin    With Provider: Iva Lentolark T Bach, MD [facility]]    Preferred Date Range: 02/27/2018  02/28/2018    Preferred Times: Any time    Reason for visit: Symptoms    Comments:  Personal

## 2018-02-27 NOTE — Telephone Encounter (Signed)
Please see below. Patient is requesting an urgent appointment due to symptoms but does not list what kind of symptoms she is having.

## 2018-02-27 NOTE — Telephone Encounter (Signed)
Called patient; left message to call back.

## 2018-02-27 NOTE — Telephone Encounter (Signed)
1st attempt- tried calling patient to find out what symptoms she is having. Left message to call back.     Mychart message sent also.

## 2018-02-28 NOTE — Telephone Encounter (Signed)
Called patient no answer left message to call back.

## 2018-03-15 ENCOUNTER — Encounter (INDEPENDENT_AMBULATORY_CARE_PROVIDER_SITE_OTHER): Payer: TRICARE Prime—HMO | Admitting: Internal Medicine

## 2018-04-13 ENCOUNTER — Encounter (INDEPENDENT_AMBULATORY_CARE_PROVIDER_SITE_OTHER): Payer: Self-pay | Admitting: Internal Medicine

## 2018-04-13 ENCOUNTER — Other Ambulatory Visit: Payer: TRICARE Prime—HMO | Attending: Internal Medicine | Admitting: Internal Medicine

## 2018-04-13 VITALS — BP 133/87 | HR 74 | Temp 99.0°F | Resp 17 | Ht 63.5 in | Wt 181.0 lb

## 2018-04-13 DIAGNOSIS — E669 Obesity, unspecified: Secondary | ICD-10-CM | POA: Insufficient documentation

## 2018-04-13 DIAGNOSIS — Z23 Encounter for immunization: Secondary | ICD-10-CM

## 2018-04-13 DIAGNOSIS — Z6831 Body mass index (BMI) 31.0-31.9, adult: Secondary | ICD-10-CM

## 2018-04-13 DIAGNOSIS — Z0000 Encounter for general adult medical examination without abnormal findings: Principal | ICD-10-CM | POA: Insufficient documentation

## 2018-04-13 DIAGNOSIS — Z8619 Personal history of other infectious and parasitic diseases: Secondary | ICD-10-CM

## 2018-04-13 DIAGNOSIS — Z124 Encounter for screening for malignant neoplasm of cervix: Secondary | ICD-10-CM | POA: Insufficient documentation

## 2018-04-13 MED ORDER — VALACYCLOVIR HCL 500 MG OR TABS
500.0000 mg | ORAL_TABLET | Freq: Every day | ORAL | 1 refills | Status: DC
Start: 2018-04-15 — End: 2018-06-29

## 2018-04-13 NOTE — Interdisciplinary (Signed)
Verified immunizations prior to administration for Autumn Gonzalez, MA.

## 2018-04-13 NOTE — Progress Notes (Signed)
CC:   Chief Complaint   Patient presents with   . Physical       Subjective:    Autumn Vincent is a 27 year old female   who presents for Physical     26yo F Jan 2019 dx genital herpes controlled on valacyclovir presents for above CC for PAP.    Patient Active Problem List   Diagnosis   . Anemia complicating pregnancy, second trimester   . History of herpes genitalis   . Screened positive for possible risky alcohol use (AUDIT score of 7-15)   . Obesity (BMI 30.0-34.9)       Review of Systems   Constitutional: Negative for chills, diaphoresis, fever, malaise/fatigue and weight loss.   HENT: Negative for congestion, ear discharge, ear pain, hearing loss, nosebleeds, sinus pain, sore throat and tinnitus.    Eyes: Negative for blurred vision, double vision, photophobia, pain, discharge and redness.   Respiratory: Negative for cough, hemoptysis, sputum production, shortness of breath, wheezing and stridor.    Cardiovascular: Negative for chest pain, palpitations, orthopnea, claudication, leg swelling and PND.   Gastrointestinal: Negative for abdominal pain, blood in stool, constipation, diarrhea, heartburn, melena, nausea and vomiting.   Genitourinary: Negative for dysuria, flank pain, frequency, hematuria and urgency.   Musculoskeletal: Negative for back pain, falls, joint pain, myalgias and neck pain.   Skin: Negative for itching and rash.   Neurological: Negative for dizziness, tingling, tremors, sensory change, speech change, focal weakness, seizures, loss of consciousness, weakness and headaches.   Endo/Heme/Allergies: Negative for environmental allergies and polydipsia. Does not bruise/bleed easily.   Psychiatric/Behavioral: Negative for depression, hallucinations, memory loss, substance abuse and suicidal ideas. The patient is not nervous/anxious and does not have insomnia.          Current Outpatient Medications on File Prior to Visit   Medication Sig Dispense Refill   . [DISCONTINUED] valACYclovir  (VALTREX) 500 MG tablet Take 1 tablet (500 mg) by mouth daily. 30 tablet 1     No current facility-administered medications on file prior to visit.        No Known Allergies    Patient Active Problem List   Diagnosis   . Anemia complicating pregnancy, second trimester   . History of herpes genitalis   . Screened positive for possible risky alcohol use (AUDIT score of 7-15)   . Obesity (BMI 30.0-34.9)       Past Medical History:   Diagnosis Date   . Anemia complicating pregnancy, second trimester 02/13/2018   . Genital herpes    . UTI (urinary tract infection)        Past Surgical History:   Procedure Laterality Date   . breast reduction  2017   . tummy tuck  2017       Social History     Socioeconomic History   . Marital status: Married     Spouse name: Not on file   . Number of children: Not on file   . Years of education: Not on file   . Highest education level: Not on file   Occupational History   . Not on file   Social Needs   . Financial resource strain: Not on file   . Food insecurity:     Worry: Not on file     Inability: Not on file   . Transportation needs:     Medical: Not on file     Non-medical: Not on file   Tobacco Use   .  Smoking status: Never Smoker   . Smokeless tobacco: Never Used   Substance and Sexual Activity   . Alcohol use: Yes     Frequency: 2-3 times a week   . Drug use: No   . Sexual activity: Yes     Partners: Male   Lifestyle   . Physical activity:     Days per week: Not on file     Minutes per session: Not on file   . Stress: Not on file   Relationships   . Social connections:     Talks on phone: Not on file     Gets together: Not on file     Attends religious service: Not on file     Active member of club or organization: Not on file     Attends meetings of clubs or organizations: Not on file     Relationship status: Not on file   . Intimate partner violence:     Fear of current or ex partner: Not on file     Emotionally abused: Not on file     Physically abused: Not on file     Forced  sexual activity: Not on file   Other Topics Concern   . Not on file   Social History Narrative   . Not on file       Family Status   Relation Status   . Mo Alive   . Fa Alive   . [facility] (Not Specified)   . Neg Hx (Not Specified)         Objective:  BP 133/87 (BP Location: Right arm, BP Patient Position: Sitting, BP cuff size: Regular)   Pulse 74   Temp 99 F (37.2 C) (Oral)   Resp 17   Ht 5' 3.5" (1.613 m)   Wt 82.1 kg (181 lb)   LMP 03/16/2018 (Exact Date)   BMI 31.56 kg/m  Body mass index is 31.56 kg/m.      Gen: in NAD, A&O, pleasant, non-toxic appearing  HEENT: NC/AT, No icterus, ptosis. Oropharynx w/out exudates or erythema. Bilateral ear canal and TM's normal.   Neck: Supple, no LAD.  Lungs: clear to auscultation bilaterally. No chest deformities noted.  No wheeze/rales/rhonchi   CV: regular rate & rhythm, nl S1, S2. No murmurs  Abdomen: Soft, + bowel sounds.  Non-tender, non-distended. No masses, organomegaly. No rebound tenderness.  Extremities: No edema.   Neurologic: Mentation appropriate. 5/5 Strength in bilateral upper and lower extremities,sensation intact.   GU: speculum exam cervix visible w whitish mucus, no lesions seen  Breasts: no lumps masses or TTP    Routine health care maintenance were dealt with today. Appropriate screening labs and studies were ordered, and preventative counseling was done.    Health Maintenance   Topic Date Due   . HPV Vaccine <= 26 Yrs (1) 07/01/2004   . INFLUENZA VACCINE  06/06/2018   . CHLAMYDIA SCREENING  09/15/2018   . PHQ2 depression screen  02/14/2019   . CERVICAL CANCER SCREENING  04/13/2021   . IMM_TD/TDAP=>11 YO  08/27/2024         Lipids:  No results found for: CHOL, HDL, LDLCALC, TRIG, LDLDIRECT    Diabetes screen:No results found for: A1C  Thyroid:  No results found for: TSH      No results found for: COLORUA, APPEARUA, GLUCOSEUA, BILIUA, KETONEUA, SGUA, BLOODUA, PHUA, PROTEINUA, UROBILUA, NITRITEUA, LEUKESTUA, WBCUA, RBCUA, EPITHCELLSUA, HYALINEUA,  CRYSTALSUA, COMMENTSUA    No results found for: WBC, RBC,  HGB, HCT, MCV, MCHC, RDW, PLT, MPV  No results found for: BUN, CREAT, CL, NA, K, [place], TBILI, ALB, TP, AST, ALK, BICARB, ALT, GLU        ASSESSMENT:/PLAN:    Physical exam  Cervical cancer screening  - Cytopath Gyn (Pap Smear)    History of herpes genitalis  - refill: valACYclovir (VALTREX) 500 MG tablet; Take 1 tablet (500 mg) by mouth daily.  Dispense: 30 tablet; Refill: 1    Obesity (BMI 30.0-34.9)  - Consult/Referral to Integrative Nutrition with RD  - simple plate diet given to pt    Need for vaccination  - Gardasil-9 HPV Vaccine 3-Dose  (Admin now)  - Gardasil-9 HPV Vaccine 3-Dose  (Admin in 2 mon); Future  - Gardasil-9 HPV Vaccine 3-Dose (Admin in 6 mon); Future      Problem List Items Addressed This Visit        Other    History of herpes genitalis    Relevant Medications    valACYclovir (VALTREX) 500 MG tablet (Start on 04/15/2018)    Obesity (BMI 30.0-34.9)    Relevant Orders    Consult/Referral to Integrative Nutrition with RD      Other Visit Diagnoses     Physical exam    -  Primary    Relevant Orders    Cytopath Gyn (Pap Smear) (Completed)    Cervical cancer screening        Relevant Orders    Cytopath Gyn (Pap Smear) (Completed)    Need for vaccination        Relevant Orders    Gardasil-9 HPV Vaccine 3-Dose  (Admin now) (Completed)    Gardasil-9 HPV Vaccine 3-Dose  (Admin in 2 mon)    Gardasil-9 HPV Vaccine 3-Dose (Admin in 6 mon)          Follow up    Return if symptoms worsen or fail to improve.    No future appointments.        Medication Review:  Medications reviewed with patient and medication list reconciled.  Over the counter medications, herbal therapies and supplements reviewed.  Patient's understanding and response to medications assessed.   Barriers to medications assessed and addressed.   Risks, benefits, alternatives to medications reviewed.      Patient Instruction: See Patient Education Section    No barriers to learning, verbalizes  understanding of teaching and instructions.    Clyde Canterbury, MD  [facility]

## 2018-04-14 ENCOUNTER — Telehealth (INDEPENDENT_AMBULATORY_CARE_PROVIDER_SITE_OTHER): Payer: Self-pay | Admitting: Internal Medicine

## 2018-04-14 NOTE — Telephone Encounter (Signed)
Emailed pt about results

## 2018-04-20 ENCOUNTER — Encounter (INDEPENDENT_AMBULATORY_CARE_PROVIDER_SITE_OTHER): Payer: Self-pay | Admitting: Internal Medicine

## 2018-04-20 ENCOUNTER — Telehealth (INDEPENDENT_AMBULATORY_CARE_PROVIDER_SITE_OTHER): Payer: Self-pay | Admitting: Internal Medicine

## 2018-04-20 LAB — HPV HIGH RISK DNA PROBE, FEMALE
HPV High Risk Genotype 16: NOT DETECTED
HPV High Risk Genotype 18: NOT DETECTED
HPV Other High Risk Genotypes (Not type 16 or 18): NOT DETECTED

## 2018-04-20 NOTE — Telephone Encounter (Signed)
Emailed pt about results

## 2018-05-06 ENCOUNTER — Encounter (INDEPENDENT_AMBULATORY_CARE_PROVIDER_SITE_OTHER): Payer: TRICARE Prime—HMO

## 2018-05-07 ENCOUNTER — Encounter (INDEPENDENT_AMBULATORY_CARE_PROVIDER_SITE_OTHER): Payer: TRICARE Prime—HMO | Admitting: Occupational Health

## 2018-05-11 ENCOUNTER — Telehealth (INDEPENDENT_AMBULATORY_CARE_PROVIDER_SITE_OTHER): Payer: Self-pay | Admitting: Internal Medicine

## 2018-05-11 NOTE — Telephone Encounter (Signed)
-----   Message from Janalyn Shy sent at 05/11/2018  9:09 AM PDT -----  Regarding: Appointment Request (HM)  Contact: [phone]  Appointment Request From: Annia Belt    With Provider: Iva Lento, MD [-Primary Care Physician-]    Preferred Date Range: Any date 05/11/2018 or later    Preferred Times: Any    Reason: To address the following health maintenance concerns.  Hpv Vaccine <= 26 Yrs    Comments:  Any appointment

## 2018-05-11 NOTE — Telephone Encounter (Signed)
Yes please schedule RN appt for 2nd HPV vaccine.

## 2018-05-11 NOTE — Telephone Encounter (Signed)
Please advise if necessary orders for patient to receive the HPV vaccine are entered and I will schedule nurse visit.

## 2018-05-12 ENCOUNTER — Encounter (INDEPENDENT_AMBULATORY_CARE_PROVIDER_SITE_OTHER): Payer: TRICARE Prime—HMO

## 2018-05-24 ENCOUNTER — Encounter (INDEPENDENT_AMBULATORY_CARE_PROVIDER_SITE_OTHER): Payer: Self-pay

## 2018-05-24 ENCOUNTER — Ambulatory Visit (INDEPENDENT_AMBULATORY_CARE_PROVIDER_SITE_OTHER): Payer: TRICARE Prime—HMO

## 2018-05-24 DIAGNOSIS — Z23 Encounter for immunization: Principal | ICD-10-CM

## 2018-05-24 NOTE — Interdisciplinary (Signed)
Gave patient gardasil shot #2 per MD immunization order. Patient verified with 2 IDs and allergies reviewed. See immunization record for admin details. Pt tolerated injection. Pt declined flu vaccine today.    Pt also reports she is travelling on a 7 day cruise to [place], [place], and [place]. Her grandmother told her to look into the need for travel vaccines. Advised patient we do not have a travel clinic in this dept and since cruise starts this Saturday it would be hard for her to get into [place] travel clinic in time. RN reviewed CDC and her vaccine history with her, advised we may be able to offer some of the recommended vaccines for those locations such as rabies but the others such as typhoid and yellow fever, we do not carry. She declines the the offer to ask Dr. Toni ArthursBach to order some travel vaccines tpday. Recommended she contact other travel clinics in the local areas if wants to review with a travel specialist, recommend [facility] or [facility]. RN also reviewed need for bug spray with deet to help avoid insect bites. Pt should also bring hand sanitizer and avoid ice and water that is not bottled. Pt aware I don't know all of the precautions and in order to be safe during her upcoming travel, recommend she review CDC for other travel precautions and recommendations before she leaves on her trip.  Verbalizes understanding and agrees with plan of care.     Pt left clinic ambulatory in no apparent distress.

## 2018-06-29 ENCOUNTER — Other Ambulatory Visit (INDEPENDENT_AMBULATORY_CARE_PROVIDER_SITE_OTHER): Payer: Self-pay | Admitting: Internal Medicine

## 2018-06-29 DIAGNOSIS — Z8619 Personal history of other infectious and parasitic diseases: Secondary | ICD-10-CM

## 2018-06-29 MED ORDER — VALACYCLOVIR HCL 500 MG OR TABS
500.0000 mg | ORAL_TABLET | Freq: Every day | ORAL | 1 refills | Status: DC
Start: 2018-06-29 — End: 2018-07-26

## 2018-06-29 MED ORDER — VALACYCLOVIR HCL 500 MG OR TABS
ORAL_TABLET | ORAL | 0 refills | Status: DC
Start: 2018-06-29 — End: 2018-06-29

## 2018-06-29 NOTE — Telephone Encounter (Signed)
rx refilled.

## 2018-06-29 NOTE — Telephone Encounter (Signed)
Last refill 04/15/2018 qty 30 with 1 refill

## 2018-07-26 ENCOUNTER — Other Ambulatory Visit (INDEPENDENT_AMBULATORY_CARE_PROVIDER_SITE_OTHER): Payer: Self-pay | Admitting: Internal Medicine

## 2018-07-26 DIAGNOSIS — Z8619 Personal history of other infectious and parasitic diseases: Secondary | ICD-10-CM

## 2018-07-26 MED ORDER — VALACYCLOVIR HCL 500 MG OR TABS
500.00 mg | ORAL_TABLET | Freq: Every day | ORAL | 1 refills | Status: DC
Start: 2018-07-26 — End: 2019-03-25

## 2018-07-26 NOTE — Telephone Encounter (Signed)
rx refilled.

## 2018-09-20 ENCOUNTER — Encounter (INDEPENDENT_AMBULATORY_CARE_PROVIDER_SITE_OTHER): Payer: Self-pay | Admitting: Internal Medicine

## 2018-09-22 NOTE — Telephone Encounter (Signed)
Please contact pt to make exam for possible gyn exam

## 2018-10-02 ENCOUNTER — Encounter (INDEPENDENT_AMBULATORY_CARE_PROVIDER_SITE_OTHER): Payer: Self-pay | Admitting: Internal Medicine

## 2018-10-02 ENCOUNTER — Encounter (INDEPENDENT_AMBULATORY_CARE_PROVIDER_SITE_OTHER): Payer: TRICARE Prime—HMO | Admitting: Internal Medicine

## 2018-10-03 ENCOUNTER — Other Ambulatory Visit: Payer: TRICARE Prime—HMO | Attending: Internal Medicine | Admitting: Internal Medicine

## 2018-10-03 ENCOUNTER — Encounter (INDEPENDENT_AMBULATORY_CARE_PROVIDER_SITE_OTHER): Payer: TRICARE Prime—HMO | Admitting: Internal Medicine

## 2018-10-03 ENCOUNTER — Telehealth (INDEPENDENT_AMBULATORY_CARE_PROVIDER_SITE_OTHER): Payer: Self-pay | Admitting: Internal Medicine

## 2018-10-03 ENCOUNTER — Encounter (INDEPENDENT_AMBULATORY_CARE_PROVIDER_SITE_OTHER): Payer: Self-pay | Admitting: Internal Medicine

## 2018-10-03 VITALS — BP 121/65 | HR 104 | Temp 98.8°F | Resp 16 | Ht 63.5 in | Wt 185.0 lb

## 2018-10-03 DIAGNOSIS — B3731 Acute candidiasis of vulva and vagina: Secondary | ICD-10-CM

## 2018-10-03 DIAGNOSIS — N898 Other specified noninflammatory disorders of vagina: Secondary | ICD-10-CM | POA: Insufficient documentation

## 2018-10-03 DIAGNOSIS — B373 Candidiasis of vulva and vagina: Secondary | ICD-10-CM

## 2018-10-03 DIAGNOSIS — B9689 Other specified bacterial agents as the cause of diseases classified elsewhere: Secondary | ICD-10-CM | POA: Insufficient documentation

## 2018-10-03 LAB — VAGINOSIS SCREEN
Candida, Vaginal Screen: POSITIVE — AB
Gardnerella, Vaginal Screen: POSITIVE — AB
Trichomonas, Vaginal Screen: NEGATIVE

## 2018-10-03 MED ORDER — METRONIDAZOLE 500 MG OR TABS
500.0000 mg | ORAL_TABLET | Freq: Two times a day (BID) | ORAL | 0 refills | Status: DC
Start: 2018-10-03 — End: 2018-11-07

## 2018-10-03 MED ORDER — FLUCONAZOLE 150 MG OR TABS
150.00 mg | ORAL_TABLET | Freq: Once | ORAL | 0 refills | Status: AC
Start: 2018-10-03 — End: 2018-10-03

## 2018-10-03 NOTE — Telephone Encounter (Signed)
Emailed pt about results

## 2018-10-03 NOTE — Patient Instructions (Signed)
Vaginal Yeast infection, Adult    Vaginal yeast infection is a condition that causes vaginal discharge as well as soreness, swelling, and redness (inflammation) of the vagina. This is a common condition. Some women get this infection frequently.  What are the causes?  This condition is caused by a change in the normal balance of the yeast (candida) and bacteria that live in the vagina. This change causes an overgrowth of yeast, which causes the inflammation.  What increases the risk?  The condition is more likely to develop in women who:   Take antibiotic medicines.   Have diabetes.   Take birth control pills.   Are pregnant.   Douche often.   Have a weak body defense system (immune system).   Have been taking steroid medicines for a long time.   Frequently wear tight clothing.  What are the signs or symptoms?  Symptoms of this condition include:   White, thick, creamy vaginal discharge.   Swelling, itching, redness, and irritation of the vagina. The lips of the vagina (vulva) may be affected as well.   Pain or a burning feeling while urinating.   Pain during sex.  How is this diagnosed?  This condition is diagnosed based on:   Your medical history.   A physical exam.   A pelvic exam. Your health care provider will examine a sample of your vaginal discharge under a microscope. Your health care provider may send this sample for testing to confirm the diagnosis.  How is this treated?  This condition is treated with medicine. Medicines may be over-the-counter or prescription. You may be told to use one or more of the following:   Medicine that is taken by mouth (orally).   Medicine that is applied as a cream (topically).   Medicine that is inserted directly into the vagina (suppository).  Follow these instructions at home:    Lifestyle   Do not have sex until your health care provider approves. Tell your sex partner that you have a yeast infection. That person should go to his or her health care  provider and ask if they should also be treated.   Do not wear tight clothes, such as pantyhose or tight pants.   Wear breathable cotton underwear.  General instructions   Take or apply over-the-counter and prescription medicines only as told by your health care provider.   Eat more yogurt. This may help to keep your yeast infection from returning.   Do not use tampons until your health care provider approves.   Try taking a sitz bath to help with discomfort. This is a warm water bath that is taken while you are sitting down. The water should only come up to your hips and should cover your buttocks. Do this 3-4 times per day or as told by your health care provider.   Do not douche.   If you have diabetes, keep your blood sugar levels under control.   Keep all follow-up visits as told by your health care provider. This is important.  Contact a health care provider if:   You have a fever.   Your symptoms go away and then return.   Your symptoms do not get better with treatment.   Your symptoms get worse.   You have new symptoms.   You develop blisters in or around your vagina.   You have blood coming from your vagina and it is not your menstrual period.   You develop pain in your abdomen.  Summary     Vaginal yeast infection is a condition that causes discharge as well as soreness, swelling, and redness (inflammation) of the vagina.   This condition is treated with medicine. Medicines may be over-the-counter or prescription.   Take or apply over-the-counter and prescription medicines only as told by your health care provider.   Do not douche. Do not have sex or use tampons until your health care provider approves.   Contact a health care provider if your symptoms do not get better with treatment or your symptoms go away and then return.  This information is not intended to replace advice given to you by your health care provider. Make sure you discuss any questions you have with your health care  provider.  Document Released: 06/02/2005 Document Revised: 01/09/2018 Document Reviewed: 01/09/2018  Elsevier Interactive Patient Education  2019 [employer]

## 2018-10-03 NOTE — Progress Notes (Signed)
CC:   Chief Complaint   Patient presents with   . Gyn Exam       Subjective:    Autumn Vincent is a 28 year old female   who presents for Gyn Exam     Pt presents for vaginal dc and itching, assoc when she is late or misses valtrex dose. Here for gyn exam.    Patient Active Problem List   Diagnosis   . Anemia complicating pregnancy, second trimester   . History of herpes genitalis   . Screened positive for possible risky alcohol use (AUDIT score of 7-15)   . Obesity (BMI 30.0-34.9)       Review of Systems   Constitutional: Negative for chills, diaphoresis, fever, malaise/fatigue and weight loss.   HENT: Negative for congestion, ear discharge, ear pain, hearing loss, nosebleeds, sinus pain, sore throat and tinnitus.    Eyes: Negative for blurred vision, double vision, photophobia, pain, discharge and redness.   Respiratory: Negative for cough, hemoptysis, sputum production, shortness of breath, wheezing and stridor.    Cardiovascular: Negative for chest pain, palpitations, orthopnea, claudication, leg swelling and PND.   Gastrointestinal: Negative for abdominal pain, blood in stool, constipation, diarrhea, heartburn, melena, nausea and vomiting.   Genitourinary: Negative for dysuria, flank pain, frequency, hematuria and urgency.        Vag dc and itching   Musculoskeletal: Negative for back pain, falls, joint pain, myalgias and neck pain.   Skin: Negative for itching and rash.   Neurological: Negative for dizziness, tingling, tremors, sensory change, speech change, focal weakness, seizures, loss of consciousness, weakness and headaches.   Endo/Heme/Allergies: Negative for environmental allergies and polydipsia. Does not bruise/bleed easily.   Psychiatric/Behavioral: Negative for depression, hallucinations, memory loss, substance abuse and suicidal ideas. The patient is not nervous/anxious and does not have insomnia.          Current Outpatient Medications on File Prior to Visit   Medication Sig Dispense  Refill   . valACYclovir (VALTREX) 500 MG tablet Take 1 tablet (500 mg) by mouth daily. 30 tablet 1     No current facility-administered medications on file prior to visit.        No Known Allergies    Patient Active Problem List   Diagnosis   . Anemia complicating pregnancy, second trimester   . History of herpes genitalis   . Screened positive for possible risky alcohol use (AUDIT score of 7-15)   . Obesity (BMI 30.0-34.9)       Past Medical History:   Diagnosis Date   . Anemia complicating pregnancy, second trimester 02/13/2018   . Genital herpes    . UTI (urinary tract infection)        Past Surgical History:   Procedure Laterality Date   . breast reduction  2017   . tummy tuck  2017       Social History     Socioeconomic History   . Marital status: Married     Spouse name: Not on file   . Number of children: Not on file   . Years of education: Not on file   . Highest education level: Not on file   Occupational History   . Not on file   Social Needs   . Financial resource strain: Not on file   . Food insecurity:     Worry: Not on file     Inability: Not on file   . Transportation needs:     Medical:  Not on file     Non-medical: Not on file   Tobacco Use   . Smoking status: Never Smoker   . Smokeless tobacco: Never Used   Substance and Sexual Activity   . Alcohol use: Yes     Frequency: 2-3 times a week   . Drug use: No   . Sexual activity: Yes     Partners: Male   Lifestyle   . Physical activity:     Days per week: Not on file     Minutes per session: Not on file   . Stress: Not on file   Relationships   . Social connections:     Talks on phone: Not on file     Gets together: Not on file     Attends religious service: Not on file     Active member of club or organization: Not on file     Attends meetings of clubs or organizations: Not on file     Relationship status: Not on file   . Intimate partner violence:     Fear of current or ex partner: Not on file     Emotionally abused: Not on file     Physically abused:  Not on file     Forced sexual activity: Not on file   Other Topics Concern   . Not on file   Social History Narrative   . Not on file       Family Status   Relation Status   . Mo Alive   . Fa Alive   . [facility] (Not Specified)   . Neg Hx (Not Specified)         Objective:  BP 121/65 (BP Location: Left arm, BP Patient Position: Sitting, BP cuff size: Regular)   Pulse 104   Temp 98.8 F (37.1 C) (Oral)   Resp 16   Ht 5' 3.5" (1.613 m)   Wt 83.9 kg (185 lb)   LMP 09/12/2018 (Exact Date)   BMI 32.26 kg/m  Body mass index is 32.26 kg/m.      Gen: in NAD, A&O, pleasant, non-toxic appearing  HEENT: NC/AT, No icterus, ptosis.   Neck: Supple  Lungs: clear to auscultation bilaterally. No chest deformities noted.  No wheeze/rales/rhonchi   CV: regular rate & rhythm, nl S1, S2. No murmurs  Abdomen: obses, Soft, + bowel sounds.  Non-tender,   Extremities: No edema.   Neurologic: Mentation appropriate. 5/5 Strength in bilateral upper and lower extremities, sensation intact.   GU: cottage cheese like dc and within vag vault    Routine health care maintenance were dealt with today. Appropriate screening labs and studies were ordered, and preventative counseling was done.    Health Maintenance   Topic Date Due   . INFLUENZA VACCINE  04/06/2018   . HPV Vaccine <= 26 Yrs (3 - Female 3-dose series) 10/14/2018   . PHQ2 depression screen  02/14/2019   . CERVICAL CANCER SCREENING  04/13/2021   . Tetanus (8 - Td) 08/27/2024   . Polio Vaccine  Aged Out   . Meningococcal MCV4 Vaccine  Aged Out         Lipids:  No results found for: CHOL, HDL, LDLCALC, TRIG, LDLDIRECT    Diabetes screen:No results found for: A1C  Thyroid:  No results found for: TSH        No results found for: COLORUA, APPEARUA, [place], BILIUA, KETONEUA, [place], [place], [place], [place], UROBILUA, NITRITEUA, LEUKESTUA, [place], RBCUA, EPITHCELLSUA, [place], CRYSTALSUA, COMMENTSUA  No results found for: WBC, RBC, HGB, HCT, MCV, MCHC, RDW, PLT, MPV  No results found  for: BUN, CREAT, CL, NA, K, [place], TBILI, ALB, TP, AST, ALK, BICARB, ALT, GLU        ASSESSMENT:/PLAN:    Vaginal discharge  Vaginal itching  Vaginal yeast infection  - Vaginosis Screen BD Affirm Collection System  - Chlamydia/GC PCR, Genital Cobas PCR collection swab Cervix  - fluCONazole (DIFLUCAN) 150 MG tablet; Take 1 tablet (150 mg) by mouth once for 1 dose.  Dispense: 1 tablet; Refill: 0      Problem List Items Addressed This Visit     None      Visit Diagnoses     Vaginal discharge    -  Primary    Relevant Medications    fluCONazole (DIFLUCAN) 150 MG tablet    Other Relevant Orders    Vaginosis Screen BD Affirm Collection System    Chlamydia/GC PCR, Genital Cobas PCR collection swab Cervix    Vaginal itching        Relevant Medications    fluCONazole (DIFLUCAN) 150 MG tablet    Other Relevant Orders    Vaginosis Screen BD Affirm Collection System    Chlamydia/GC PCR, Genital Cobas PCR collection swab Cervix    Vaginal yeast infection        Relevant Medications    fluCONazole (DIFLUCAN) 150 MG tablet    Other Relevant Orders    Vaginosis Screen BD Affirm Collection System    Chlamydia/GC PCR, Genital Cobas PCR collection swab Cervix          Follow up    Return if symptoms worsen or fail to improve.    No future appointments.        Medication Review:  Medications reviewed with patient and medication list reconciled.  Over the counter medications, herbal therapies and supplements reviewed.  Patient's understanding and response to medications assessed.   Barriers to medications assessed and addressed.   Risks, benefits, alternatives to medications reviewed.      Patient Instruction: See Patient Education Section    No barriers to learning, verbalizes understanding of teaching and instructions.    Clyde Canterbury, MD  [facility]

## 2018-10-04 ENCOUNTER — Encounter (INDEPENDENT_AMBULATORY_CARE_PROVIDER_SITE_OTHER): Payer: Self-pay | Admitting: Internal Medicine

## 2018-10-04 LAB — CHLAMYDIA/GONORRHEA PCR, GENITAL
Chlamydia trachomatis PCR: NOT DETECTED
Neisseria gonorrhoeae PCR: NOT DETECTED

## 2018-11-06 ENCOUNTER — Encounter (INDEPENDENT_AMBULATORY_CARE_PROVIDER_SITE_OTHER): Payer: Self-pay | Admitting: Internal Medicine

## 2018-11-06 DIAGNOSIS — B379 Candidiasis, unspecified: Principal | ICD-10-CM

## 2018-11-06 DIAGNOSIS — N760 Acute vaginitis: Secondary | ICD-10-CM

## 2018-11-06 DIAGNOSIS — B9689 Other specified bacterial agents as the cause of diseases classified elsewhere: Secondary | ICD-10-CM

## 2018-11-07 ENCOUNTER — Encounter (INDEPENDENT_AMBULATORY_CARE_PROVIDER_SITE_OTHER): Payer: Self-pay | Admitting: Internal Medicine

## 2018-11-07 MED ORDER — FLUCONAZOLE 150 MG OR TABS
150.0000 mg | ORAL_TABLET | Freq: Once | ORAL | 0 refills | Status: AC
Start: 2018-11-07 — End: 2018-11-07

## 2018-11-07 MED ORDER — METRONIDAZOLE 500 MG OR TABS
500.0000 mg | ORAL_TABLET | Freq: Two times a day (BID) | ORAL | 0 refills | Status: DC
Start: 2018-11-07 — End: 2024-05-16

## 2018-12-29 ENCOUNTER — Encounter (INDEPENDENT_AMBULATORY_CARE_PROVIDER_SITE_OTHER): Payer: Self-pay | Admitting: Internal Medicine

## 2018-12-29 DIAGNOSIS — B373 Candidiasis of vulva and vagina: Secondary | ICD-10-CM

## 2018-12-29 DIAGNOSIS — B3731 Acute candidiasis of vulva and vagina: Secondary | ICD-10-CM

## 2018-12-29 DIAGNOSIS — B379 Candidiasis, unspecified: Principal | ICD-10-CM

## 2018-12-29 DIAGNOSIS — N898 Other specified noninflammatory disorders of vagina: Secondary | ICD-10-CM

## 2018-12-29 MED ORDER — FLUCONAZOLE 150 MG OR TABS
150.0000 mg | ORAL_TABLET | Freq: Once | ORAL | 0 refills | Status: AC
Start: 2018-12-29 — End: 2018-12-29

## 2018-12-29 NOTE — Telephone Encounter (Signed)
Please contact pt to make clinic appt for exam and swab for labs

## 2019-01-05 NOTE — Telephone Encounter (Signed)
With PRIMARY CARE Iva Lento, MD)  01/12/2019 at 9:40 AM

## 2019-01-12 ENCOUNTER — Encounter (INDEPENDENT_AMBULATORY_CARE_PROVIDER_SITE_OTHER): Payer: TRICARE Prime—HMO | Admitting: Internal Medicine

## 2019-01-15 ENCOUNTER — Ambulatory Visit (INDEPENDENT_AMBULATORY_CARE_PROVIDER_SITE_OTHER): Payer: TRICARE Prime—HMO | Admitting: Internal Medicine

## 2019-01-15 VITALS — BP 115/76 | HR 85 | Temp 97.9°F | Wt 189.8 lb

## 2019-01-15 DIAGNOSIS — Z8619 Personal history of other infectious and parasitic diseases: Secondary | ICD-10-CM

## 2019-01-15 DIAGNOSIS — Z23 Encounter for immunization: Secondary | ICD-10-CM

## 2019-01-15 DIAGNOSIS — N760 Acute vaginitis: Secondary | ICD-10-CM

## 2019-01-15 DIAGNOSIS — B9689 Other specified bacterial agents as the cause of diseases classified elsewhere: Secondary | ICD-10-CM

## 2019-01-15 DIAGNOSIS — Z09 Encounter for follow-up examination after completed treatment for conditions other than malignant neoplasm: Secondary | ICD-10-CM

## 2019-01-15 MED ORDER — HAIR SKIN AND NAILS FORMULA PO: ORAL | Status: AC

## 2019-01-15 NOTE — Progress Notes (Signed)
CC:   Chief Complaint   Patient presents with   . Follow up Results       Subjective:    Autumn Vincent is a 28 year old female   who presents for Follow up Results         In jan 2020 pt had vaginal dc and itching, assoc when she is late or misses valtrex dose, was found to have yeast and BV infection, completed fluconazole and metronidazole.    Then mar 2020 had repeat sx, but only completed fluconazole and didn't take metronidazole.    Then Apr 24 pt writes:  "  Hello, I'm not sure why I'm having recurring Yeast Infections. Should I get tested again? My symptoms are back... can I come in after work at 6?   ...  The same as before, itchy and thick white discharge. Ever since I was first diagnosed with yeast infection in comes back almost every 2 months. Same symptoms.   "    Pt was rx and completed fluconazole, today w no sx.    Is open to seeing OBGYN for recurring sx.    Patient Active Problem List   Diagnosis   . Anemia complicating pregnancy, second trimester   . History of herpes genitalis   . Screened positive for possible risky alcohol use (AUDIT score of 7-15)   . Obesity (BMI 30.0-34.9)   . BV (bacterial vaginosis)       Review of Systems   Constitutional: Negative for chills, diaphoresis, fever, malaise/fatigue and weight loss.   HENT: Negative for congestion, ear discharge, ear pain, hearing loss, nosebleeds, sinus pain, sore throat and tinnitus.    Eyes: Negative for blurred vision, double vision, photophobia, pain, discharge and redness.   Respiratory: Negative for cough, hemoptysis, sputum production, shortness of breath, wheezing and stridor.    Cardiovascular: Negative for chest pain, palpitations, orthopnea, claudication, leg swelling and PND.   Gastrointestinal: Negative for abdominal pain, blood in stool, constipation, diarrhea, heartburn, melena, nausea and vomiting.   Genitourinary: Negative for dysuria, flank pain, frequency, hematuria and urgency.   Musculoskeletal: Negative for  back pain, falls, joint pain, myalgias and neck pain.   Skin: Negative for itching and rash.   Neurological: Negative for dizziness, tingling, tremors, sensory change, speech change, focal weakness, seizures, loss of consciousness, weakness and headaches.   Endo/Heme/Allergies: Negative for environmental allergies and polydipsia. Does not bruise/bleed easily.   Psychiatric/Behavioral: Negative for depression, hallucinations, memory loss, substance abuse and suicidal ideas. The patient is not nervous/anxious and does not have insomnia.          Current Outpatient Medications on File Prior to Visit   Medication Sig Dispense Refill   . metroNIDAZOLE (FLAGYL) 500 MG tablet Take 1 tablet (500 mg) by mouth 2 times daily (with meals). 14 tablet 0   . Multiple Vitamins-Minerals (HAIR SKIN AND NAILS FORMULA PO)      . valACYclovir (VALTREX) 500 MG tablet Take 1 tablet (500 mg) by mouth daily. 30 tablet 1     No current facility-administered medications on file prior to visit.        No Known Allergies    Patient Active Problem List   Diagnosis   . Anemia complicating pregnancy, second trimester   . History of herpes genitalis   . Screened positive for possible risky alcohol use (AUDIT score of 7-15)   . Obesity (BMI 30.0-34.9)   . BV (bacterial vaginosis)       Past  Medical History:   Diagnosis Date   . Anemia complicating pregnancy, second trimester 02/13/2018   . Genital herpes    . UTI (urinary tract infection)        Past Surgical History:   Procedure Laterality Date   . breast reduction  2017   . tummy tuck  2017       Social History     Socioeconomic History   . Marital status: Married     Spouse name: Not on file   . Number of children: Not on file   . Years of education: Not on file   . Highest education level: Not on file   Occupational History   . Not on file   Social Needs   . Financial resource strain: Not on file   . Food insecurity:     Worry: Not on file     Inability: Not on file   . Transportation needs:      Medical: Not on file     Non-medical: Not on file   Tobacco Use   . Smoking status: Never Smoker   . Smokeless tobacco: Never Used   Substance and Sexual Activity   . Alcohol use: Yes     Frequency: 2-3 times a week   . Drug use: No   . Sexual activity: Yes     Partners: Male   Lifestyle   . Physical activity:     Days per week: Not on file     Minutes per session: Not on file   . Stress: Not on file   Relationships   . Social connections:     Talks on phone: Not on file     Gets together: Not on file     Attends religious service: Not on file     Active member of club or organization: Not on file     Attends meetings of clubs or organizations: Not on file     Relationship status: Not on file   . Intimate partner violence:     Fear of current or ex partner: Not on file     Emotionally abused: Not on file     Physically abused: Not on file     Forced sexual activity: Not on file   Other Topics Concern   . Not on file   Social History Narrative   . Not on file       Family Status   Relation Status   . Mo Alive   . Fa Alive   . [facility] (Not Specified)   . Neg Hx (Not Specified)         Objective:  BP 115/76 (BP Location: Left arm, BP Patient Position: Sitting, BP cuff size: Regular)   Pulse 85   Temp 97.9 F (36.6 C) (Oral)   Wt 86.1 kg (189 lb 12.8 oz)   LMP 01/04/2019   SpO2 98%   BMI 33.09 kg/m  Body mass index is 33.09 kg/m.      Gen: in NAD, A&O, pleasant, non-toxic appearing  HEENT: NC/AT, No icterus, ptosis.   Neck: Supple  Lungs: clear to auscultation bilaterally. No chest deformities noted.  No wheeze/rales/rhonchi   CV: regular rate & rhythm, nl S1, S2. No murmurs  Abdomen: obese, Soft, + bowel sounds.  Non-tender,   Extremities: No edema.   Neurologic: Mentation appropriate. 5/5 Strength in bilateral upper and lower extremities, sensation intact.     Routine health care maintenance were dealt with today.  Appropriate screening labs and studies were ordered, and preventative counseling was  done.    Health Maintenance   Topic Date Due   . HPV Vaccine <= 26 Yrs (3 - Female 3-dose series) 10/14/2018   . PHQ2 depression screen  02/14/2019   . INFLUENZA VACCINE  04/07/2019   . CERVICAL CANCER SCREENING  04/13/2021   . Tetanus (8 - Td) 08/27/2024   . Polio Vaccine  Completed   . Meningococcal MCV4 Vaccine  Aged Out         Lipids:  No results found for: CHOL, HDL, LDLCALC, TRIG, LDLDIRECT    Diabetes screen:No results found for: A1C  Thyroid:  No results found for: TSH        No results found for: COLORUA, APPEARUA, GLUCOSEUA, BILIUA, KETONEUA, SGUA, BLOODUA, PHUA, PROTEINUA, UROBILUA, NITRITEUA, LEUKESTUA, [place], RBCUA, EPITHCELLSUA, HYALINEUA, CRYSTALSUA, COMMENTSUA    No results found for: WBC, RBC, HGB, HCT, MCV, MCHC, RDW, PLT, MPV  No results found for: BUN, CREAT, CL, NA, K, [place], TBILI, ALB, TP, AST, ALK, BICARB, ALT, GLU        ASSESSMENT:/PLAN:  hx 2019 genital herpes on rx, when missed rx thinks assoc w yeast infection, 3x yeast infection in last year, 1x BV, would like to see [facility]    History of candidal vulvovaginitis  BV (bacterial vaginosis)  - [facility]    Need for vaccination  - HPV 9-Valent (GARDASIL-9) Vaccine    Follow up      Problem List Items Addressed This Visit        Reproductive    BV (bacterial vaginosis)    Relevant Orders    [facility]      Other Visit Diagnoses     History of candidal vulvovaginitis    -  Primary    Relevant Orders    [facility] Clinic    Need for vaccination        Relevant Orders    HPV 9-Valent (GARDASIL-9) Vaccine (Completed)    Follow up              Follow up    Return if symptoms worsen or fail to improve.    No future appointments.        Medication Review:  Medications reviewed with patient and medication list reconciled.  Over the counter medications, herbal therapies and supplements reviewed.  Patient's understanding and response to medications assessed.   Barriers to medications  assessed and addressed.   Risks, benefits, alternatives to medications reviewed.      Patient Instruction: See Patient Education Section    No barriers to learning, verbalizes understanding of teaching and instructions.    Clyde Canterbury, MD  [facility]

## 2019-01-25 ENCOUNTER — Other Ambulatory Visit: Payer: TRICARE Prime—HMO | Attending: Nurse Practitioner | Admitting: Nurse Practitioner

## 2019-01-25 ENCOUNTER — Encounter (INDEPENDENT_AMBULATORY_CARE_PROVIDER_SITE_OTHER): Payer: Self-pay | Admitting: Nurse Practitioner

## 2019-01-25 VITALS — BP 91/68 | HR 88 | Temp 97.7°F | Ht 63.5 in | Wt 188.9 lb

## 2019-01-25 DIAGNOSIS — N898 Other specified noninflammatory disorders of vagina: Secondary | ICD-10-CM | POA: Insufficient documentation

## 2019-01-25 DIAGNOSIS — Z8742 Personal history of other diseases of the female genital tract: Secondary | ICD-10-CM

## 2019-01-25 LAB — VAGINOSIS SCREEN
Candida, Vaginal Screen: POSITIVE — AB
Gardnerella, Vaginal Screen: NEGATIVE
Trichomonas, Vaginal Screen: NEGATIVE

## 2019-01-25 NOTE — Patient Instructions (Signed)
Nice to meet you!    I will contact you tomorrow with your results.      Vulvar Care and Suggestions to Aid in Vulvar Pain and Itching   Careful care of the vulva can help alleviate some symptoms of itching, burning and irritation since the vulvar skin can be quite sensitive. The inner vulva is moist and has very thin skin, so may be more susceptible to injury. There are various strategies that can be used to support the vulva. Below is a list of techniques to help you with itching and pain.   1. Avoid contact with irritating chemicals: this includes any product that has soaps, perfumes and detergents like fabric softener, deodorant soap, bubble bath, feminine hygiene spray, scented or deodorized menstrual products and scented bath products. We do not endorse the use of Vagisil benzocaine, or over-the-counter products used for "feminine hygiene and itching." These contain potential irritants.   2. Wear 100% cotton underwear: enclosing the vulva in synthetic products holds both heat and moisture, conditions that worsen many vulvar conditions. Avoid use of panty hose, thongs, tights or other close fitting clothes for similar reasons. If you need to wear panty hose, cut the crotch out of the nylons to reduce the clinginess to the body.   3. Do not over-clean your vulva and rinse the skin off with plain water: use tap water only. The vulva is not dirty. You can also use sitz (shallow) baths, squirt bottles or bidets to aid in your hygiene. Pat the skin gently dry or dry with a cool hair dryer, but do not over dry since this can deprive the vulva of necessary moisture and oils. Rinse the vulva last after shampooing and washing as this will eliminate any residual soap or shampoos on the vulva. Overzealous washing (more than once a day) can lead to dryness, skin irritation, and susceptibility to inflammation. Do you not use scrub brushes, wash clothes or buffers on the vulva. You should wash gently with your hands  only.   4. Use mild soap: If you prefer to use soap on the vulva, use a mild one such as Neutrogena, Basis, Pears (made in [place]), Cetaphil (non-fragranced) or castile soap with olive oil. These are found at most pharmacies or health food stores. Remember that frequent use of soap will remove natural skin oils and may cause itching. It may be best to avoid soaps altogether and just rinse with plain water.   5. Try a compress of Aveeno: Aveeno is an anti-irritant that comes in many forms. You can try a compress made with the powdered oatmeal bath treatment. Place 2 tablespoons of Aveeno in 1-2 pints of water. Mix in a jar and refrigerate. You can apply this to the vulva 3-4 times a day as needed. Some find it helpful after intercourse when symptoms can typically flare.   6. Try cool wet tea bags: There is some therapeutic effect from tannins in tea. Wet plain black tea bags applied to inflamed tissue can be soothing. The wet tea bags should be applied directly to the skin and may be held in place with a menstrual pad.   7. Always use lubricants with sexual activity: Couples using barrier contraception (condoms or diaphragm) should use water-based or silicone-based lubricants. Water-based lubricants include as Astroglide, FemGlide, Wet, or Slippery Stuff. Two silicone-based lubricants include PJUR, Eros, and Pure Romance. Avoid the use of K-Y Jelly, which can become tacky and sticky. If you are not using latex barriers (condoms or   diaphragm) as contraception, you may try oil-based lubricants such as mineral oil, vitamin D oil or olive oil. Silicone lubricants have good qualities for menopausal women. [employer] on line carries some brands. Avoid oils that have flavors, scents, and sensation enhancers which can be more irritating to the vulvar skin.   8. Other suggestions: try to avoid sitting in a wet bathing suit or damp gym clothes. Do not douche. Avoid contraceptive devices that require spermicides  that can irritate the skin.   9. Leave the vulva uncovered: allowing the vulva to be free of clothing and underwear. This can increase comfort. Consider uncovering in the evening and during the night.   10. Cool compresses: You can reduce pain and itching by using cool compresses on the vulva. They also can reduce swelling and irritation after a minor vulvar procedure. Prepare these by putting a wet face cloth in plastic and then freezing or refrigerating it. You can also use a package of frozen peas wrapped in a clean soft cloth. Be careful that the compresses aren't so cold that you become numb, since this can put your skin at risk. You can also fill a plastic dishwashing detergent bottle with water, freeze it, and put it by your bed at night. If you awaken with itching or pain, put this bottle (wrapped in a thin soft cloth) between your legs.   11. Avoid hair removal: Although it is common for many women to remove their pubic hair by shaving, waxing, lasering, and using NAIR type products, this can aggravate the skin. Avoid these activities. Pubic hair growth after removal can feel "itchy", but usually this is for a short period of time. Try to use some of the strategies noted above to decrease your irritation and discomfort.   12. Artificial skin barrier: For vulvar comfort it is often helpful to use a bland emollient to protect the skin from friction, sweat, or discharge. Emollients are products like Vaseline, plain Crisco and Aquaphor or zinc oxide. They can help restore some of that barrier function as well as provide a soothing, frictionless sensation to the skin. These products can be used between medication applications (if your provider has prescribed them) or alone on a daily basis or before exercise.

## 2019-01-25 NOTE — Progress Notes (Signed)
Problem Visit    Autumn Vincent is a 28 year old G2P2 female who presents c/o recurrent vaginal infections over the last several months.      Vaginosis screen 10/03/18: +BV +yeast  11/2018: Symptoms returned, was treated presumptively for BV and yeast.  12/2018: treated again with Fluconazole presumptively    Thinks she had a vaginal infection for many months prior to initial diagnosis, but was mistaking vaginal itching for HSV prodromal symptoms.      Today, she reports mild vaginal itching with some scant white discharge.  Symptoms were worse a couple days ago, but have improved.      Married, husband is deployed so not currently sexually active.      Does not douche. Uses Summer's Eve wipes occasionally.  Wears pantiliners.      Review of Systems  Constitutional: Negative  Eyes: Negative  ENT: Negative  Cardiac: Negative  Pulmonary: Negative  Gastrointestional: Negative  Musculoskeletal: Negative  Skin: Negative  Neurologic: Negative  Psychiatric: Negative  Endocrine: Negative  Blood Disease: Negative  Allergy: Negative  OB/Gyn: Vaginal discharge      Past medical, surgical, OB/GYN, family and social history reviewed today and updated in the EMR.    Past Medical History:   Diagnosis Date   . Anemia complicating pregnancy, second trimester 02/13/2018   . Genital herpes    . UTI (urinary tract infection)        Past Surgical History:   Procedure Laterality Date   . breast reduction  2017   . tummy tuck  2017       Family History   Problem Relation Name Age of Onset   . No Known Problems Mother     . No Known Problems Father     . Hypertension Maternal Grandmother     . Cancer Neg Hx     . Diabetes Neg Hx     . Psychiatry Neg Hx     . Heart Disease Neg Hx     . Stroke Neg Hx     . No Known Cancer Neg Hx     . No Known Heart Disease Neg Hx     . No Known Diabetes Neg Hx     . Thyroid Neg Hx         Social History:  Social History     Tobacco Use   Smoking Status Never Smoker   Smokeless Tobacco Never Used        Social History     Substance and Sexual Activity   Alcohol Use Yes   . Frequency: 2-3 times a week           Allergies:  Patient has no known allergies.    Current medications:  See EMR      PHYSICAL EXAMINATION:  BP 91/68 (BP Location: Left arm, BP Patient Position: Sitting, BP cuff size: Regular)   Pulse 88   Temp 97.7 F (36.5 C) (Oral)   Ht 5' 3.5" (1.613 m)   Wt 85.7 kg (188 lb 14.4 oz)   LMP 01/04/2019   SpO2 100%   BMI 32.94 kg/m    General Appearance: in no apparent distress, well developed and well nourished, alert and oriented times 3      Pelvic Exam:  Vulva: Normal external genitalia  Urethra: normal  Bladder: normal and normal urethral meatus  Vagina: Normal mucosa, scant white discharge  Cervix: Parous, closed, mobile, no discharge  Anus/Perineum: normal appearing, no lesions  Rectal: deferred      Assessment and plan:  1. History of vaginitis  Given history of both yeast and BV with trouble distinguishing symptoms, encouraged her to come for vaginal swab when experiencing symptoms so that she is treated accurately.    Vulvovaginal care discussed at length, discouraged use of douching products, vaginal washes, or pantiliners.    Advised to avoid damp, tight clothing.  Suggested she try taking a probiotic.      2. Vaginal itching  Vaginosis screen obtained  Will contact patient with results.           Aveah Castell WHNP-BC

## 2019-01-26 ENCOUNTER — Encounter (INDEPENDENT_AMBULATORY_CARE_PROVIDER_SITE_OTHER): Payer: Self-pay | Admitting: Nurse Practitioner

## 2019-01-26 DIAGNOSIS — B3731 Acute candidiasis of vulva and vagina: Secondary | ICD-10-CM

## 2019-01-26 MED ORDER — FLUCONAZOLE 150 MG OR TABS
150.00 mg | ORAL_TABLET | Freq: Once | ORAL | 0 refills | Status: AC
Start: 2019-01-26 — End: 2019-01-26

## 2019-01-26 NOTE — Telephone Encounter (Signed)
From: Annia Belt  To: Ronn Melena, NP  Sent: 01/26/2019 9:42 AM PDT  Subject: [id number]    Okay thank you, so take 1 today and another in 3 days?  ----- Message -----  From: Ronn Melena, NP  Sent: 01/26/2019 8:17 AM PDT  To: Autumn Vincent  Subject: Results  Hi [name],    Your vaginal swab was positive for yeast. Negative for bacterial vaginosis.     I am sending a prescription to your pharmacy for the Diflucan pill. I will have you take two doses 72 hours apart.     Please reach out with any questions or concerns.     Take care,   Wonda Amis, NP

## 2019-02-21 ENCOUNTER — Encounter (INDEPENDENT_AMBULATORY_CARE_PROVIDER_SITE_OTHER): Payer: Self-pay | Admitting: Nurse Practitioner

## 2019-02-21 NOTE — Telephone Encounter (Signed)
From: Bernestine Amass  To: Jettie Pagan, NP  Sent: 02/21/2019 12:41 PM PDT  Subject: QP:[id number]    Hi Amy, I am having the same symptoms for Yeast... again.Marland Kitchen  ----- Message -----  From: Jettie Pagan, NP  Sent: 01/26/2019 10:57 AM PDT  To: Bernestine Amass  Subject: [id number]:[id number]  Yes, correct.       ----- Message -----   From: Bernestine Amass   Sent: 01/26/2019 9:42 AM PDT   To: Jettie Pagan, NP  Subject: VX:[id number]    Okay thank you, so take 1 today and another in 3 days?  ----- Message -----  From: Jettie Pagan, NP  Sent: 01/26/2019 8:17 AM PDT  To: Meredith Staggers Paulus  Subject: Results  Hi Melisse,    Your vaginal swab was positive for yeast. Negative for bacterial vaginosis.     I am sending a prescription to your pharmacy for the Diflucan pill. I will have you take two doses 72 hours apart.     Please reach out with any questions or concerns.     Take care,   Susy [place], NP

## 2019-02-22 ENCOUNTER — Other Ambulatory Visit: Payer: TRICARE Prime—HMO | Attending: Nurse Practitioner | Admitting: Nurse Practitioner

## 2019-02-22 ENCOUNTER — Encounter (INDEPENDENT_AMBULATORY_CARE_PROVIDER_SITE_OTHER): Payer: Self-pay | Admitting: Nurse Practitioner

## 2019-02-22 VITALS — BP 112/64 | HR 85 | Temp 97.2°F | Ht 63.5 in | Wt 189.7 lb

## 2019-02-22 DIAGNOSIS — N898 Other specified noninflammatory disorders of vagina: Secondary | ICD-10-CM

## 2019-02-22 DIAGNOSIS — B3731 Acute candidiasis of vulva and vagina: Secondary | ICD-10-CM

## 2019-02-22 LAB — VAGINOSIS SCREEN
Candida, Vaginal Screen: POSITIVE — AB
Gardnerella, Vaginal Screen: NEGATIVE
Trichomonas, Vaginal Screen: NEGATIVE

## 2019-02-22 MED ORDER — CLOTRIMAZOLE-BETAMETHASONE 1-0.05 % EX CREA
1.0000 | TOPICAL_CREAM | Freq: Two times a day (BID) | CUTANEOUS | 0 refills | Status: DC | PRN
Start: 2019-02-22 — End: 2019-02-23

## 2019-02-22 NOTE — Progress Notes (Signed)
Problem Visit    Autumn Vincent is a 28 year old G72P2 female who presents c/o vaginal itching and discharge.      She has had multiple vaginal infections of the last several months.  10/03/18: vaginosis +BV +yeast  11/2018: Symptoms returned, was treated presumptively for BV and yeast.  12/2018: treated again with Fluconazole presumptively  01/25/19: vaginosis +yeast, Tx with Diflucan x 2.      Symptoms did improve after last course of treatment, but just recently returned.  Has stopped wearing pantiliners and not wearing underwear at night.  Married, husband is deployed so not currently sexually active.      Review of Systems  Constitutional: Negative  Eyes: Negative  ENT: Negative  Cardiac: Negative  Pulmonary: Negative  Gastrointestional: Negative  Musculoskeletal: Negative  Skin: Negative  Neurologic: Negative  Psychiatric: Negative  Endocrine: Negative  Blood Disease: Negative  Allergy: Negative  OB/Gyn: Vaginal discharge      Past medical, surgical, OB/GYN, family and social history reviewed today and updated in the EMR.    Past Medical History:   Diagnosis Date   . Anemia complicating pregnancy, second trimester 02/13/2018   . Genital herpes    . UTI (urinary tract infection)        Past Surgical History:   Procedure Laterality Date   . breast reduction  2017   . tummy tuck  2017       Family History   Problem Relation Name Age of Onset   . No Known Problems Mother     . No Known Problems Father     . Hypertension Maternal Grandmother     . Cancer Neg Hx     . Diabetes Neg Hx     . Psychiatry Neg Hx     . Heart Disease Neg Hx     . Stroke Neg Hx     . No Known Cancer Neg Hx     . No Known Heart Disease Neg Hx     . No Known Diabetes Neg Hx     . Thyroid Neg Hx         Social History:  Social History     Tobacco Use   Smoking Status Never Smoker   Smokeless Tobacco Never Used       Social History     Substance and Sexual Activity   Alcohol Use Yes   . Frequency: 2-3 times a week           Allergies:  Patient  has no known allergies.    Current medications:  See EMR      PHYSICAL EXAMINATION:  BP 112/64 (BP Location: Left arm, BP Patient Position: Sitting, BP cuff size: Regular)   Pulse 85   Temp 97.2 F (36.2 C) (Temporal)   Ht 5' 3.5" (1.613 m)   Wt 86 kg (189 lb 11.2 oz)   LMP 01/30/2019 (Exact Date)   SpO2 98%   BMI 33.08 kg/m    General Appearance: in no apparent distress, well developed and well nourished, alert and oriented times 3      Pelvic Exam:  Vulva: Normal external genitalia  Urethra: normal  Bladder: normal and normal urethral meatus  Vagina: Normal mucosa, curdlike discharge   Cervix: Parous, closed, mobile, no discharge  Anus/Perineum: normal appearing, no lesions        Assessment and plan:    1. Vaginal itching  2. Vaginal discharge  Exam suspicious for yeast, but will  await vaginosis screen for confirmation.  Yeast culture collected given rapid recurrence.    Rx for topical lotrisone in the interim.  Could also consider Hgb A1c if yeast again.   Will contact patient with results tomorrow.           Julieanne MansonA. Boutelle, WHNP

## 2019-02-22 NOTE — Patient Instructions (Signed)
I will be in touch tomorrow with you results.      Pick up the topical cream at the pharmacy.

## 2019-02-23 ENCOUNTER — Telehealth (INDEPENDENT_AMBULATORY_CARE_PROVIDER_SITE_OTHER): Payer: Self-pay | Admitting: Nurse Practitioner

## 2019-02-23 ENCOUNTER — Telehealth (INDEPENDENT_AMBULATORY_CARE_PROVIDER_SITE_OTHER): Payer: Self-pay | Admitting: Family Practice

## 2019-02-23 MED ORDER — FLUCONAZOLE 150 MG OR TABS
150.0000 mg | ORAL_TABLET | ORAL | 0 refills | Status: DC
Start: 2019-02-23 — End: 2024-05-16

## 2019-02-23 MED ORDER — CLOTRIMAZOLE-BETAMETHASONE 1-0.05 % EX CREA
1.0000 | TOPICAL_CREAM | Freq: Two times a day (BID) | CUTANEOUS | 0 refills | Status: DC | PRN
Start: 2019-02-23 — End: 2024-05-16

## 2019-02-23 NOTE — Telephone Encounter (Signed)
Rx tee'd up to [facility] to authorize.    Thank you.

## 2019-02-23 NOTE — Telephone Encounter (Signed)
Please call to inform of +yeast on vaginosis.  Rx sent for fluconazole x 3 doses 72 hours apart.  Sent mychart message earlier, but has not been read.  Please ensure she is aware.   Kameo Bains, WHNP-BC

## 2019-02-23 NOTE — Telephone Encounter (Signed)
From: Bernestine Amass  To: Jettie Pagan, NP  Sent: 02/22/2019 6:06 PM PDT  Subject: 20-Other    Hello, I didn't realize [facility]'t take [employer], any way you can resend prescription the the [address] [facility]? And the future, prescriptions? Please and thank you.

## 2019-02-23 NOTE — Telephone Encounter (Signed)
Spoke to pt to inform her of Amy's message.  Pt verbalized acknowledgment.

## 2019-02-25 NOTE — Telephone Encounter (Signed)
On call provider note:  Patient calling. States she was told she has BV but given Fluconazole. Reviewed labs with patient, positive Candida not BV. Patient in understanding and will pick up medication.

## 2019-03-02 LAB — YEAST CULTURE: Yeast Culture Result: NO GROWTH

## 2019-03-24 ENCOUNTER — Other Ambulatory Visit (INDEPENDENT_AMBULATORY_CARE_PROVIDER_SITE_OTHER): Payer: Self-pay | Admitting: Internal Medicine

## 2019-03-24 DIAGNOSIS — Z8619 Personal history of other infectious and parasitic diseases: Secondary | ICD-10-CM

## 2019-03-25 ENCOUNTER — Other Ambulatory Visit (INDEPENDENT_AMBULATORY_CARE_PROVIDER_SITE_OTHER): Payer: Self-pay | Admitting: Internal Medicine

## 2019-03-25 DIAGNOSIS — Z8619 Personal history of other infectious and parasitic diseases: Secondary | ICD-10-CM

## 2019-03-26 MED ORDER — VALACYCLOVIR HCL 500 MG OR TABS
500.0000 mg | ORAL_TABLET | Freq: Every day | ORAL | 1 refills | Status: DC
Start: 2019-03-26 — End: 2020-01-15

## 2019-03-26 NOTE — Telephone Encounter (Signed)
rx refilled.

## 2019-03-26 NOTE — Telephone Encounter (Signed)
Last auth 07/27/2019 qty 30 tabs with 1 refill

## 2019-03-26 NOTE — Telephone Encounter (Signed)
duplicate

## 2019-09-15 ENCOUNTER — Encounter: Payer: Self-pay | Admitting: Hospital

## 2019-09-21 ENCOUNTER — Encounter: Payer: Self-pay | Admitting: Hospital

## 2019-12-05 ENCOUNTER — Encounter (INDEPENDENT_AMBULATORY_CARE_PROVIDER_SITE_OTHER): Payer: Self-pay

## 2020-01-15 ENCOUNTER — Other Ambulatory Visit (INDEPENDENT_AMBULATORY_CARE_PROVIDER_SITE_OTHER): Payer: Self-pay | Admitting: Internal Medicine

## 2020-01-15 DIAGNOSIS — Z8619 Personal history of other infectious and parasitic diseases: Secondary | ICD-10-CM

## 2020-01-15 NOTE — Telephone Encounter (Signed)
Established with:  Last OV with PCP:  Next OV with Dept:  Future Appointments: Autumn Vincent   Visit date not found  Visit date not found  No future appointments.     Medication requested:   Requested Prescriptions     Pending Prescriptions Disp Refills    valACYclovir (VALTREX) 500 MG tablet [Pharmacy Med Name: VALACYCLOVIR 500MG  TABLETS] 30 tablet 1     Sig: TAKE 1 TABLET(500 MG) BY MOUTH DAILY       Last Filled Date 03/26/19   Quantity Last Filled  30 tasb   Refill 1   Send to:    [id number] - [place], [place] - [address] AT [facility] [address]  [address]  [place]  Phone: [phone] Fax: [phone]        Current Medication(s):  Current Outpatient Medications   Medication Sig Dispense Refill    clotrimazole-betamethasone (LOTRISONE) 1-0.05 % cream Apply 1 Application topically 2 times daily as needed (for vulvar itching). Use a small amount as directed 1 Tube 0    fluCONazole (DIFLUCAN) 150 MG tablet Take 1 tablet (150 mg) by mouth every 72 hours. For 3 doses. 3 tablet 0    metroNIDAZOLE (FLAGYL) 500 MG tablet Take 1 tablet (500 mg) by mouth 2 times daily (with meals). 14 tablet 0    Multiple Vitamins-Minerals (HAIR SKIN AND NAILS FORMULA PO)       valACYclovir (VALTREX) 500 MG tablet Take 1 tablet (500 mg) by mouth daily. 30 tablet 1     No current facility-administered medications for this visit.        Patient information: No Known Allergies   Health Maintenance Due   Topic Date Due    COVID-19 Vaccine (1) Never done    PHQ2 depression screen  02/14/2019      Last labs:  No results found for: CHOL, HDL, LDLCALC, TRIG, LDLDIRECT, TSH, A1C   Blood Pressure   02/22/19 112/64   01/25/19 91/68   01/15/19 115/76    No components found for: 4M

## 2020-01-16 MED ORDER — VALACYCLOVIR HCL 500 MG OR TABS
ORAL_TABLET | ORAL | 0 refills | Status: DC
Start: 2020-01-16 — End: 2020-05-14

## 2020-05-01 ENCOUNTER — Encounter (INDEPENDENT_AMBULATORY_CARE_PROVIDER_SITE_OTHER): Payer: Self-pay | Admitting: Internal Medicine

## 2020-05-01 DIAGNOSIS — Z0000 Encounter for general adult medical examination without abnormal findings: Secondary | ICD-10-CM

## 2020-05-14 ENCOUNTER — Encounter (INDEPENDENT_AMBULATORY_CARE_PROVIDER_SITE_OTHER): Payer: Self-pay | Admitting: Internal Medicine

## 2020-05-14 DIAGNOSIS — Z8619 Personal history of other infectious and parasitic diseases: Secondary | ICD-10-CM

## 2020-05-14 MED ORDER — VALACYCLOVIR HCL 500 MG OR TABS
ORAL_TABLET | ORAL | 1 refills | Status: DC
Start: 2020-05-14 — End: 2020-11-21

## 2020-05-14 NOTE — Telephone Encounter (Signed)
From: Annia Belt  To: Iva Lento, MD  Sent: 05/14/2020 7:13 AM PDT  Subject: RE:rx    Hi, no Im just visiting [place] while my husband is deployed. Ill be back when they come back. Is there any way to get more than one refill at at a time. So I dont have to request? Im down to the last few pills.      ----- Message -----   From:Raeli Wiens Dionne Ano, MD   Sent:01/16/2020 9:54 AM PDT   RF:[id number] Clista Bernhardt   Subject:RE:rx    Hi we sent you   valACYclovir (VALTREX) 500 MG tablet             Take 500 mg twice daily for 3 days for herpes outbreaks, Disp-30 tablet    Did you move to [facility]?  Thanks.      ----- Message -----   From:Kailah Clista Bernhardt   Sent:01/16/2020 9:20 AM PDT   KZ:[id number] Dionne Ano, MD   Subject:RE:rx    For As needed.      ----- Message -----   From:Latesha Chesney Dionne Ano, MD   Sent:01/15/2020 12:32 PM PDT   SV:[id number] Clista Bernhardt   Subject:rx    Hi we received message for valtrex refill, are you taking as needed for acute outbreaks or taking daily for suppression?  Thanks

## 2020-10-01 IMAGING — MR MRI ABDOMEN WO CONTRAST
7 series · 48 of 48 positions shown · non-contrast
Comparison: None.

HISTORY: Abnormal findings on ultrasound liver and abnormal liver function tests.
TECHNIQUE: MRI of abdomen without contrast. Coronal and axial noncontrast images of abdomen are obtained.

[Series 2: t2_cor · coronal · 6.0mm · 1.25mm/px · 5 of 36 slices shown]
[im 1/36]
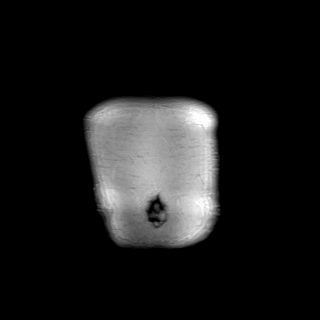
[im 9/36]
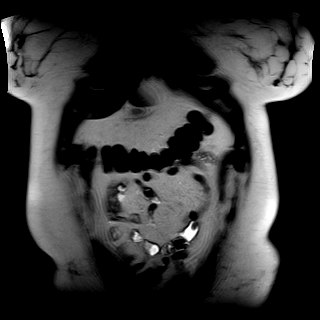
[im 18/36]
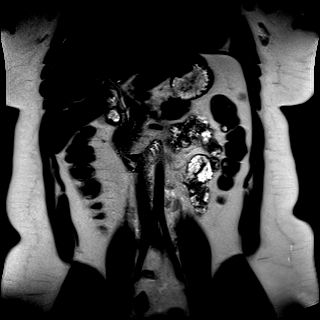
[im 27/36]
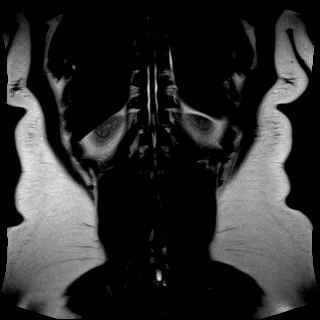
[im 36/36]
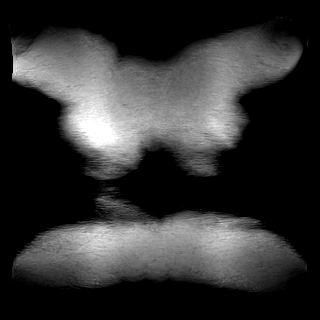

[Series 3: t2_axial_fs_bh · axial · B · 6.0mm · 1.19mm/px · z∈[-126,+112]mm · 4 of 34 slices shown]
[im 1/34]
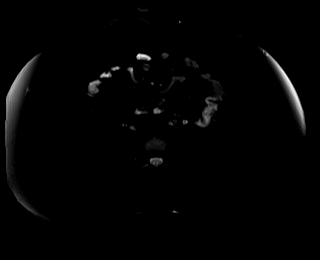
[im 12/34]
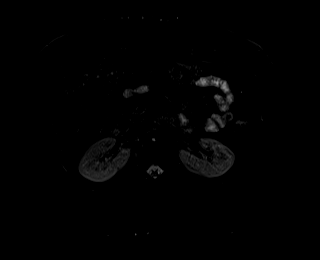
[im 23/34]
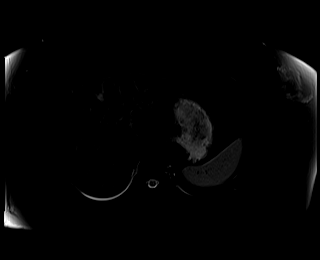
[im 34/34]
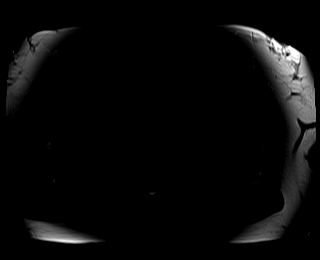

[Series 4: t1_vibe_(person_name)_pre_opp · axial · 3.0mm · 1.56mm/px · z∈[-102,+111]mm · 9 of 72 slices shown]
[im 1/72]
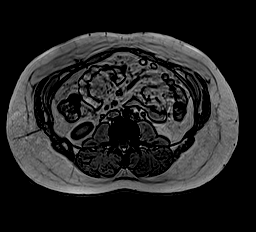
[im 9/72]
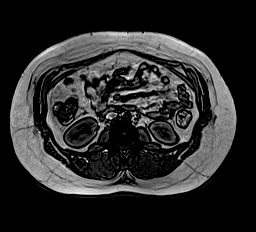
[im 18/72]
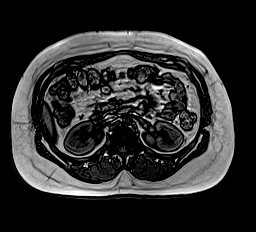
[im 27/72]
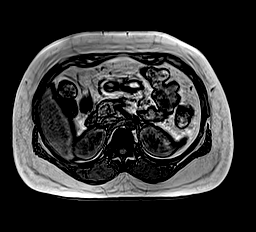
[im 36/72]
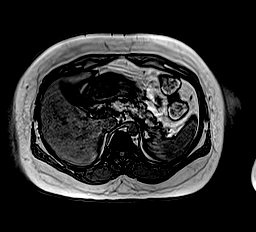
[im 45/72]
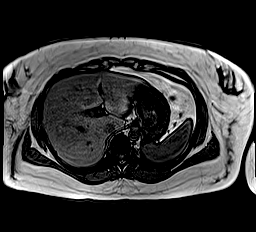
[im 54/72]
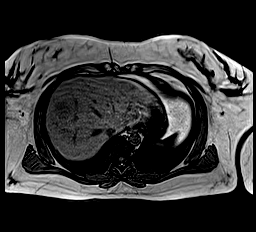
[im 63/72]
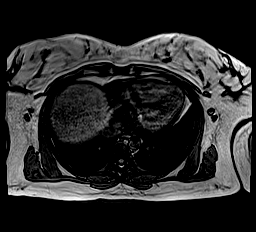
[im 72/72]
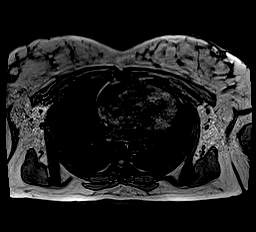

[Series 5: t1_vibe_(person_name)_pre_in · axial · 3.0mm · 1.56mm/px · z∈[-102,+111]mm · 9 of 72 slices shown]
[im 1/72]
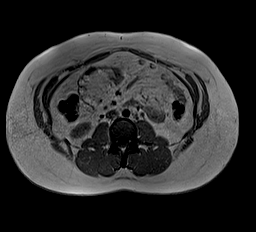
[im 9/72]
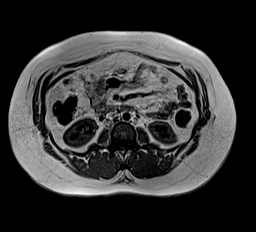
[im 18/72]
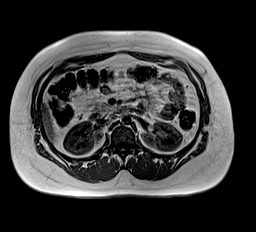
[im 27/72]
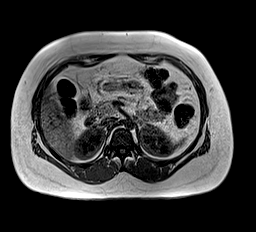
[im 36/72]
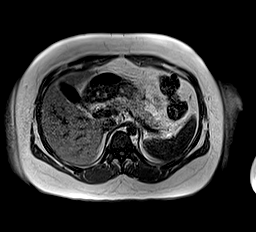
[im 45/72]
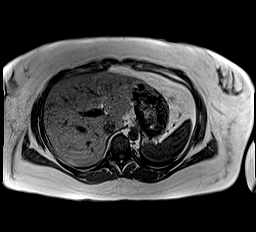
[im 54/72]
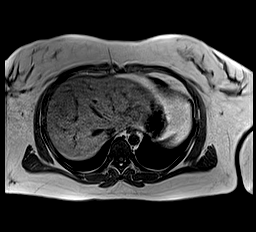
[im 63/72]
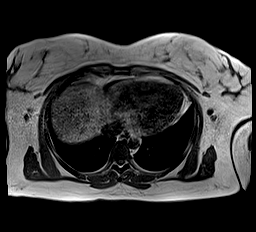
[im 72/72]
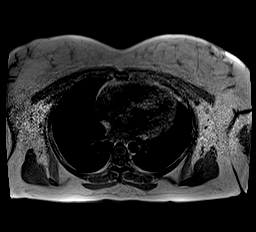

[Series 7: t1_vibe_(person_name)_pre_w · axial · 3.0mm · 1.56mm/px · z∈[-102,+111]mm · 9 of 72 slices shown]
[im 1/72]
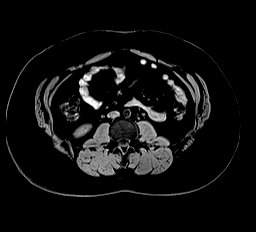
[im 9/72]
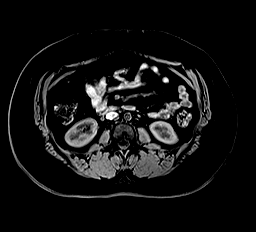
[im 18/72]
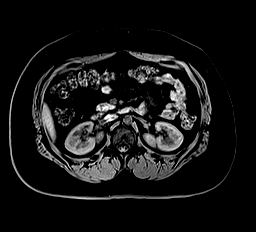
[im 27/72]
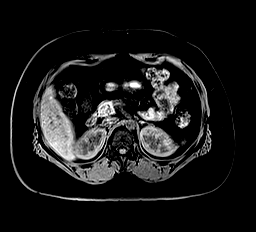
[im 36/72]
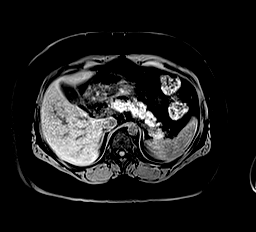
[im 45/72]
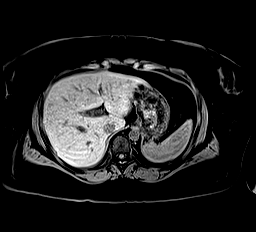
[im 54/72]
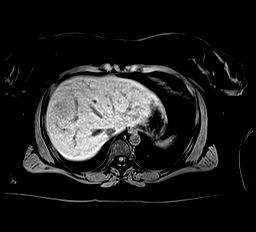
[im 63/72]
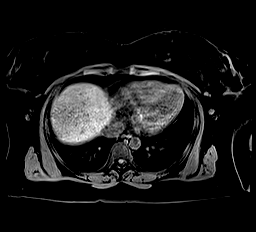
[im 72/72]
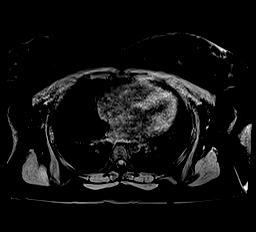

[Series 8: ep2d_diff_b50_800_(person_name)_free breath_tracew · axial · 6.0mm · 1.49mm/px · z∈[-93,+116]mm · 8 of 60 slices shown]
[im 1/60]
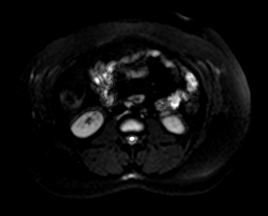
[im 9/60]
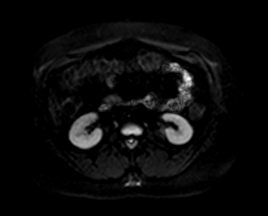
[im 17/60]
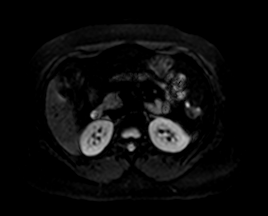
[im 26/60]
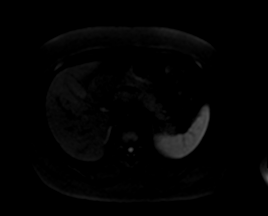
[im 34/60]
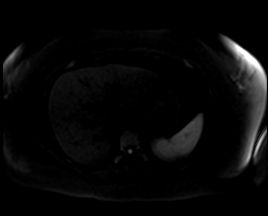
[im 43/60]
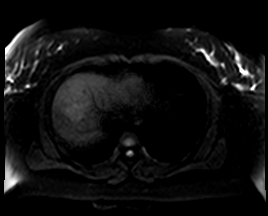
[im 51/60]
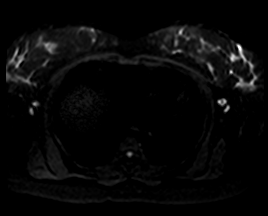
[im 60/60]
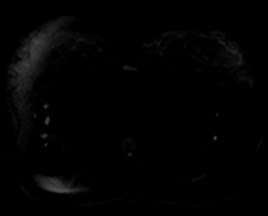

[Series 9: ep2d_diff_b50_800_(person_name)_free breath_adc · axial · 6.0mm · 1.49mm/px · z∈[-93,+116]mm · 4 of 30 slices shown]
[im 1/30]
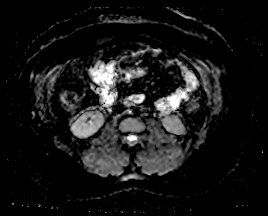
[im 10/30]
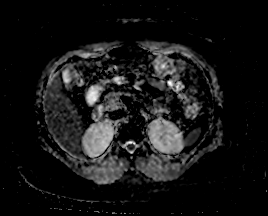
[im 20/30]
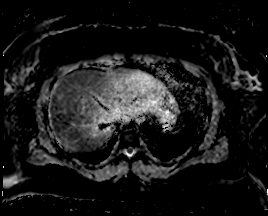
[im 30/30]
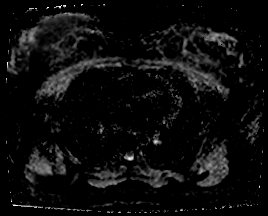

[48 of 48 positions shown; findings below may reference images not displayed]

FINDINGS: No mass lesion of lung bases identified. There is mild cardiomegaly. There is no pericardial effusion. Aorta and inferior vena cava are normal.

Slightly decreased T1 signal on T1 opposed-phase images identified. Subtle 4.3 cm T1-hypointense lesion involving segment VIII of liver with minimal T2 shine-through identified without definitive restricted diffusion. No other obvious liver mass lesion identified. Liver contour is smooth.

Gallbladder, pancreas, spleen, adrenal glands and kidneys are normal.

Distal esophagus, stomach and bowel loops are normal.

No enlarged retroperitoneal lymph node is seen.

Marrow signal is within normal limits. Visualized portions of breast tissue are normal. Increased subcutaneous adipose tissue is seen. Mild dependent subcutaneous edema in lumbar region posteriorly is seen.
IMPRESSION: Questionable liver lesion involving segment VIII of liver identified. Differential diagnosis would include focal nodular hyperplasia. Contrast MRI liver evaluation with Eovist is advised.

Slightly decreased T1 signal of liver possibly due to fatty infiltration is seen. Liver ultrasound elastography study may provide more information.

Additional chronic findings as noted.

## 2020-11-21 ENCOUNTER — Encounter (INDEPENDENT_AMBULATORY_CARE_PROVIDER_SITE_OTHER): Payer: Self-pay | Admitting: Internal Medicine

## 2020-11-21 DIAGNOSIS — Z8619 Personal history of other infectious and parasitic diseases: Secondary | ICD-10-CM

## 2020-11-21 MED ORDER — VALACYCLOVIR HCL 500 MG OR TABS
ORAL_TABLET | ORAL | 0 refills | Status: DC
Start: 2020-11-21 — End: 2024-05-16

## 2020-11-21 NOTE — Telephone Encounter (Signed)
Please contact pt for 40min appt, last visit may 2020

## 2020-11-24 NOTE — Telephone Encounter (Signed)
Pr Dr Toni Arthurs, MCVV set for 4/24 at 1120am  MyChart Message sent to pt for e-check in     Thank You,  Alvino [facility] Assistant]

## 2020-11-24 NOTE — Telephone Encounter (Signed)
Please advice patient requesting virtual visit and  we have any available for this week.

## 2020-11-27 ENCOUNTER — Encounter (INDEPENDENT_AMBULATORY_CARE_PROVIDER_SITE_OTHER): Payer: Self-pay | Admitting: Internal Medicine

## 2020-11-27 ENCOUNTER — Other Ambulatory Visit: Payer: Self-pay

## 2020-11-27 ENCOUNTER — Telehealth (INDEPENDENT_AMBULATORY_CARE_PROVIDER_SITE_OTHER): Payer: TRICARE Prime—HMO | Admitting: Internal Medicine

## 2020-11-27 DIAGNOSIS — Z8619 Personal history of other infectious and parasitic diseases: Secondary | ICD-10-CM

## 2020-11-27 DIAGNOSIS — Z1159 Encounter for screening for other viral diseases: Secondary | ICD-10-CM

## 2020-11-27 NOTE — Progress Notes (Signed)
---------------------(data below generated by Jeanne Diefendorf T Wyley Hack, MD)--------------------     Patient Verification & Telemedicine Consent & Financial Waiver:    1.   Identity: I have verified this patient's identity to be accurate.  2.   Consent: I verify consent has been secured in one of the following methods: (a) obtained written/ online attestation consent (via MyChartVideoVisit pathway), (b) the spoke-side provider has obtained verbal or written consent from patient/surrogate (if this is a "provider to provider" evaluation), or (c) in all other cases, I have personally obtained verbal consent from the patient/ surrogate (noting all elements below) to perform this voluntary telemedicine evaluation (including obtaining history, performing examination and reviewing data provided by the patient).   The patient/ surrogate has the right to refuse this evaluation.  I have explained risks (including potential loss of confidentiality), benefits, alternatives, and the potential need for subsequent face to face care. Patient/ surrogate understands that there is a risk of medical inaccuracies given that our recommendations will be made based on reported data (and we must therefore assume this information is accurate).  Knowing that there is a risk that this information is not reported accurately, and that the telemedicine video, audio, or data feed may be incomplete, the patient agrees to proceed with evaluation and holds us harmless knowing these risks.  3.   Healthcare Team: The patient/ surrogate has been notified that other healthcare professionals (including students, residents and technical personnel) may be involved in this audio-video evaluation.   All laws concerning confidentiality and patient access to medical records and copies of medical records apply to telemedicine.  4.   Privacy: If this is a MyChart Video Visit, the patient/ surrogate has received the [place] Notice of Privacy Practices via E-Checkin process.  For  all other video visit techniques, I have verbally provided the patient/ surrogate with the [place] web link in English (https://health.[place].edu/hipaa/Pages/hipaa.aspx) or Spanish (https://health.[place].edu/hipaa/Pages/hipaa_sp.aspx).  The patient/ surrogate acknowledges both being provided the NPP link, and has been offered to have the NPP mailed to the patient/ surrogate by US mail.  The patient/ surrogate has voiced understanding an acknowledgement of receipt of this NPP web address.  If the patient/surrogate has elected to receive the NPP via US mail, I verify that the NPP will be sent promptly to the patient/surrogate via US mail.  5.   Capacity: I have reviewed this above verification and consent paragraph with the patient/ surrogate and the patient is capacitated or has a surrogate. If the patient is not capacitated to understand the above, and no surrogate is available, since this is not an emergency evaluation, the visit will be rescheduled until such time that the patient can consent, or the surrogate is available to consent. If this is an emergency evaluation and the patient is not capacitated to understand the above, and no surrogate is available, I am proceeding with this evaluation as this is felt to be an emergency setting and no appropriate specialist is available at the bedside to perform these evaluations.  6.   Financial Waiver: If this is a MyChart Video Visit, the patient has been made aware of the financial waiver via E-Checkin process.  For all other video visit techniques, an E-Checkin process is not performed.  As such, I have personally verbally informed the patient/ surrogate that this evaluation will be a billable encounter similar to an in-person clinic visit, and the patient/ surrogate has agreed to pay the fee for services rendered.  If we are billing insurance for the   patient's telehealth visit, her out-of-pocket cost will be determined based on her plan and will be billed to her.  The  patient/ surrogate has also been informed that if the patient does not have insurance or does not wish to use insurance, [place] [employer] price for a primary care telehealth visit is $59.00 and specialist telehealth visit is $88.00.  I have further informed the patient/ surrogate that in the event the patient has additional services provided in conjunction with the specialty visit (Ex. Psychotherapy services), those services will be billed at the current rate less a 45% discount.  7.   Intra-State Location: The patient/ surrogate attests to understanding that if the patient accesses these services from a location outside of [place], that the patient does so at the patient's own risk and initiative and that the patient is ultimately responsible for compliance with any laws or regulations associated with the patient's use.  8.   Specific Use:The patient/ surrogate understands that [facility] makes no representation that materials or servicesdelivered via telecommunication services, or listed on telemedicine websites, are appropriate or available for use in any other location.           Demographics:  Medical Record #: [id number]  Date: November 27, 2020  Patient Name: Autumn Vincent  DOB: 1991/08/19  Age: 30 year old  Sex: female  Location: Home address on file     Evaluator(s):  Nana Vastine was evaluated by me today.    Clinic Location: [facility]   [facility]  [phone] [place] [place]  [place] [address]                          Due to COVID-19 pandemic and a federally declared state of public health  emergency, this service is being conducted via video.       CC:   Chief Complaint   Patient presents with    Recheck       Subjective:    Autumn Vincent is a 30 year old female   who presents for Recheck     Last visit may 2020.  No acute complaints.  Pt open to completing HM, just needs hep C screen    Pt open to RTC for VS later this year.    Reviewed  valtrex w pt, pt would like few refills for next bottle, for monthly outbreaks.    Patient Active Problem List   Diagnosis    Anemia complicating pregnancy, second trimester    History of herpes genitalis    Screened positive for possible risky alcohol use (AUDIT score of 7-15)    Obesity (BMI 30.0-34.9)    BV (bacterial vaginosis)       Review of Systems   Constitutional: Negative for chills, diaphoresis, fever, malaise/fatigue and weight loss.   HENT: Negative for congestion, ear discharge, ear pain, hearing loss, nosebleeds, sinus pain, sore throat and tinnitus.    Eyes: Negative for blurred vision, double vision, photophobia, pain, discharge and redness.   Respiratory: Negative for cough, hemoptysis, sputum production, shortness of breath, wheezing and stridor.    Cardiovascular: Negative for chest pain, palpitations, orthopnea, claudication, leg swelling and PND.   Gastrointestinal: Negative for abdominal pain, blood in stool, constipation, diarrhea, heartburn, melena, nausea and vomiting.   Genitourinary: Negative for dysuria, flank pain, frequency, hematuria and urgency.   Musculoskeletal: Negative for back pain, falls, joint  pain, myalgias and neck pain.   Skin: Negative for itching and rash.   Neurological: Negative for dizziness, tingling, tremors, sensory change, speech change, focal weakness, seizures, loss of consciousness, weakness and headaches.   Endo/Heme/Allergies: Negative for environmental allergies and polydipsia. Does not bruise/bleed easily.   Psychiatric/Behavioral: Negative for depression, hallucinations, memory loss, substance abuse and suicidal ideas. The patient is not nervous/anxious and does not have insomnia.          Current Outpatient Medications on File Prior to Visit   Medication Sig Dispense Refill    clotrimazole-betamethasone (LOTRISONE) 1-0.05 % cream Apply 1 Application topically 2 times daily as needed (for vulvar itching). Use a small amount as directed 1 Tube 0     fluCONazole (DIFLUCAN) 150 MG tablet Take 1 tablet (150 mg) by mouth every 72 hours. For 3 doses. 3 tablet 0    metroNIDAZOLE (FLAGYL) 500 MG tablet Take 1 tablet (500 mg) by mouth 2 times daily (with meals). 14 tablet 0    Multiple Vitamins-Minerals (HAIR SKIN AND NAILS FORMULA PO)       valACYclovir (VALTREX) 500 MG tablet Take 500 mg twice daily for 3 days 30 tablet 0     No current facility-administered medications on file prior to visit.       No Known Allergies    Patient Active Problem List   Diagnosis    Anemia complicating pregnancy, second trimester    History of herpes genitalis    Screened positive for possible risky alcohol use (AUDIT score of 7-15)    Obesity (BMI 30.0-34.9)    BV (bacterial vaginosis)       Past Medical History:   Diagnosis Date    Anemia complicating pregnancy, second trimester 02/13/2018    Genital herpes     UTI (urinary tract infection)        Past Surgical History:   Procedure Laterality Date    breast reduction  2017    tummy tuck  2017       Social History     Socioeconomic History    Marital status: Married     Spouse name: Not on file    Number of children: Not on file    Years of education: Not on file    Highest education level: Not on file   Occupational History    Not on file   Tobacco Use    Smoking status: Never Smoker    Smokeless tobacco: Never Used   Substance and Sexual Activity    Alcohol use: Yes    Drug use: No    Sexual activity: Not Currently     Partners: Male     Birth control/protection: Condom   Other Topics Concern    Not on file   Social History Narrative    Married    2 children     Social Determinants of Health     Financial Resource Strain: Not on file   Food Insecurity: Not on file   Transportation Needs: Not on file   Physical Activity: Not on file   Stress: Not on file   Social Connections: Not on file   Intimate Partner Violence: Not on file   Housing Stability: Not on file       Family Status   Relation Status    Mo Alive     Fa Alive    [facility] (Not Specified)    Neg Hx (Not Specified)         Objective:  There were no vitals taken for this visit. There is no height or weight on file to calculate BMI.        NO IN PERSON PE SINCE VIDEO VISIT  ON VIDEO PT APPEARS  General: awake, alert, non-diaphoretic, no psychomotor agitation   Head: atraumatic, no rashes/lesions noted   Eyes: no redness, discharge, swelling or lesions   Nose: no redness, swelling, discharge   Skin: no lesions/wounds/erythema or cyanosis on face or hands   CV: no increased respiratory effort   MSK: normal rom of neck, UE   Psych: appearance, behavior, and attitude-well groomed, pleasant, cooperative         Routine health care maintenance were dealt with today. Appropriate screening labs and studies were ordered, and preventative counseling was done.    Health Maintenance   Topic Date Due    COVID-19 Vaccine (1) Never done    Hepatitis C Screening  Never done    Influenza (1) 12/01/2020 (Originally 04/06/2020)    PHQ2 depression screen  11/27/2021    Pap Smear  04/14/2023    Tetanus (8 - Td or Tdap) 08/27/2024    Polio Vaccine  Completed    HPV Vaccine <= 26 Yrs  Completed    Meningococcal MCV4 Vaccine  Aged Out    Pneumococcal Vaccine  Aged Out         Lipids:  No results found for: CHOL, HDL, LDLCALC, TRIG, LDLDIRECT    Diabetes screen:No results found for: A1C  Thyroid:  No results found for: TSH        No results found for: COLORUA, APPEARUA, GLUCOSEUA, BILIUA, KETONEUA, SGUA, BLOODUA, PHUA, [place], UROBILUA, NITRITEUA, LEUKESTUA, [place], RBCUA, EPITHCELLSUA, HYALINEUA, CRYSTALSUA, COMMENTSUA    No results found for: WBC, RBC, HGB, HCT, MCV, MCHC, RDW, PLT, MPV  No results found for: BUN, CREAT, CL, NA, K, [place], TBILI, ALB, TP, AST, ALK, BICARB, ALT, GLU        ASSESSMENT:/PLAN:    Need for hepatitis C screening test  - Hepatitis C Antibody (Hep C Ab); Future    History of herpes genitalis  - reviewed sx and med w pt, will refill w extra refills next  time    Problem List Items Addressed This Visit        Other    History of herpes genitalis      Other Visit Diagnoses     Need for hepatitis C screening test    -  Primary    Relevant Orders    Hepatitis C Antibody (Hep C Ab)          Follow up    No follow-ups on file.    No future appointments.        Medication Review:  Medications reviewed with patient and medication list reconciled.  Over the counter medications, herbal therapies and supplements reviewed.  Patient's understanding and response to medications assessed.   Barriers to medications assessed and addressed.   Risks, benefits, alternatives to medications reviewed.      Patient Instruction: See Patient Education Section    No barriers to learning, verbalizes understanding of teaching and instructions.    Clyde Canterbury, MD  [facility]

## 2020-12-01 IMAGING — MR MRI ABDOMEN WO/W Eovist Contrast
11 series · 48 of 48 positions shown · IV contrast (10cc EOVIST)
Comparison: MRI of the abdomen without contrast 10/01/2020.

HISTORY: Abnormal liver function tests and imaging. Liver lesion.
TECHNIQUE: Multiplanar, multisequence MRI of the abdomen without and with contrast was performed.

Contrast dose: 10 mL Eovist.

[Series 2: t2_cor · coronal · 5.0mm · 1.25mm/px · 3 of 35 slices shown]
[im 1/35]
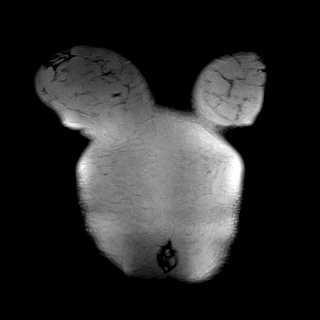
[im 18/35]
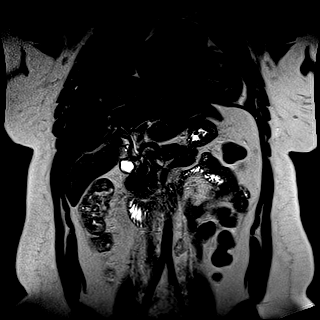
[im 35/35]
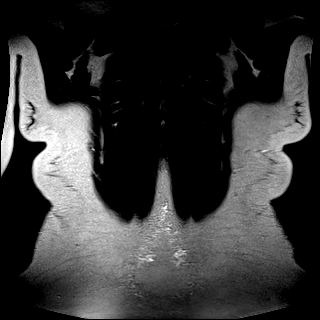

[Series 4: ep2d_diff_b50_800_(person_name)_free breath_tracew · axial · 6.0mm · 1.42mm/px · z∈[-30,+179]mm · 5 of 60 slices shown]
[im 1/60]
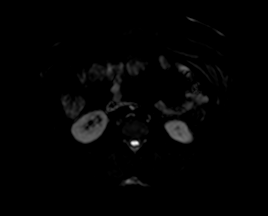
[im 15/60]
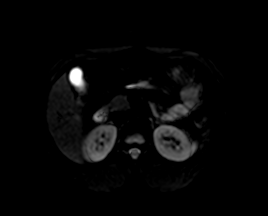
[im 30/60]
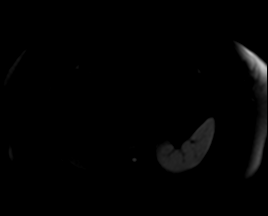
[im 45/60]
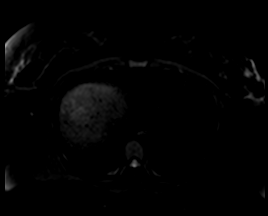
[im 60/60]
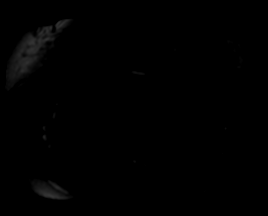

[Series 5: ep2d_diff_b50_800_(person_name)_free breath_adc · axial · 6.0mm · 1.42mm/px · z∈[-30,+179]mm · 2 of 30 slices shown]
[im 1/30]
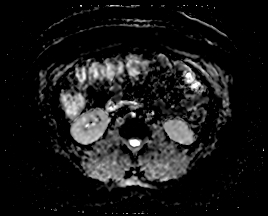
[im 30/30]
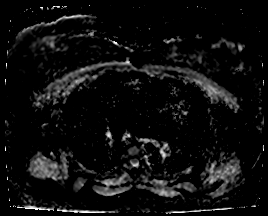

[Series 6: t2_axial_fs (haste) · axial · 6.0mm · 1.25mm/px · z∈[-84,+182]mm · 3 of 38 slices shown]
[im 1/38]
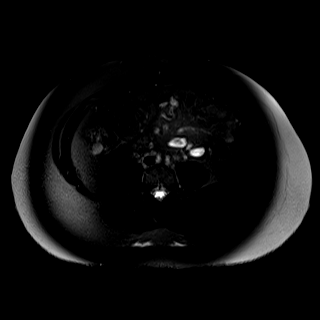
[im 19/38]
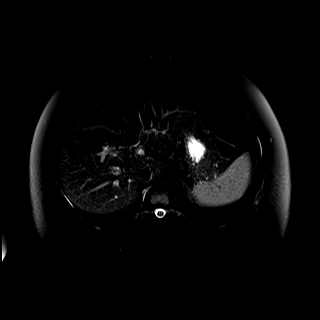
[im 38/38]
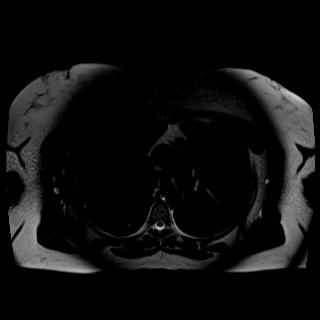

[Series 7: t1_vibe_(person_name)_pre_opp · axial · 3.0mm · 1.56mm/px · z∈[-62,+175]mm · 5 of 80 slices shown]
[im 1/80]
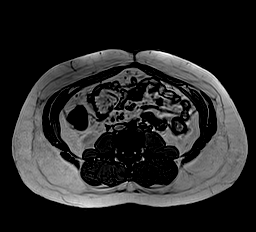
[im 20/80]
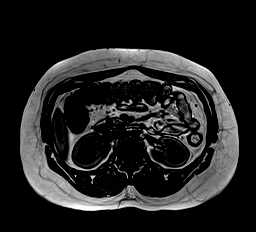
[im 40/80]
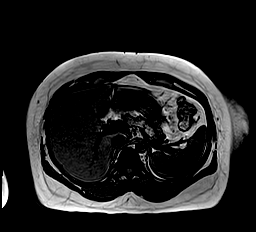
[im 60/80]
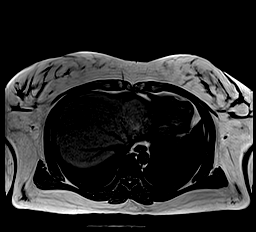
[im 80/80]
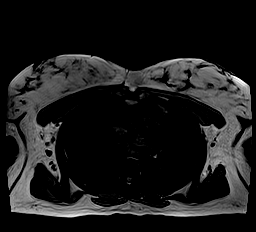

[Series 8: t1_vibe_(person_name)_pre_in · axial · 3.0mm · 1.56mm/px · z∈[-62,+175]mm · 5 of 80 slices shown]
[im 1/80]
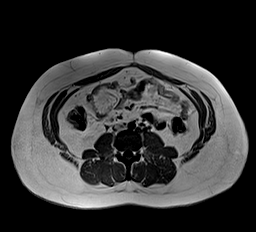
[im 20/80]
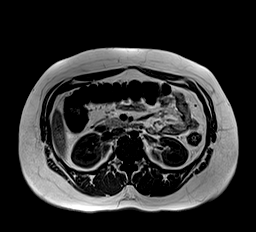
[im 40/80]
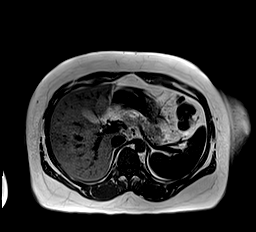
[im 60/80]
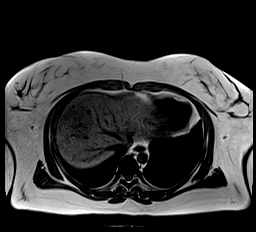
[im 80/80]
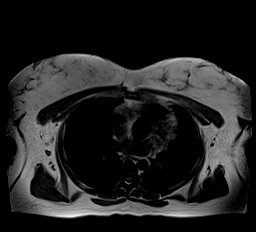

[Series 10: t1_vibe_(person_name)_pre_w · axial · 3.0mm · 1.56mm/px · z∈[-62,+175]mm · 5 of 80 slices shown]
[im 1/80]
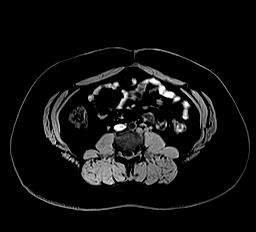
[im 20/80]
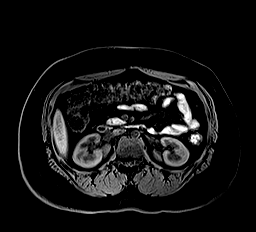
[im 40/80]
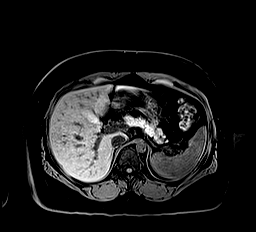
[im 60/80]
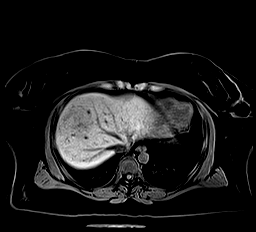
[im 80/80]
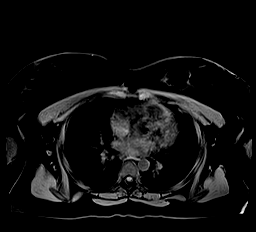

[Series 14: t1_vibe_axial_(person_name)_+c1_w · axial · 3.0mm · 1.56mm/px · z∈[-62,+175]mm · 5 of 80 slices shown]
[im 1/80]
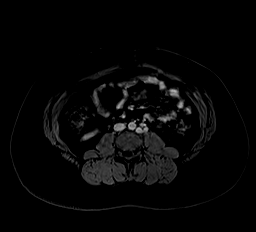
[im 20/80]
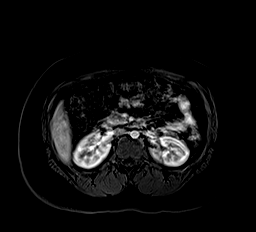
[im 40/80]
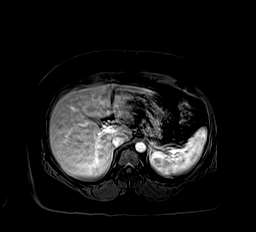
[im 60/80]
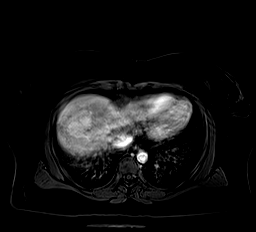
[im 80/80]
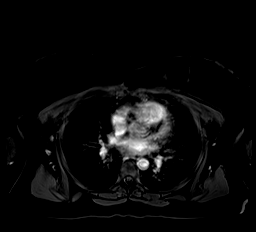

[Series 18: t1_vibe_axial_(person_name)_+c2_w · axial · 3.0mm · 1.56mm/px · z∈[-62,+175]mm · 5 of 80 slices shown]
[im 1/80]
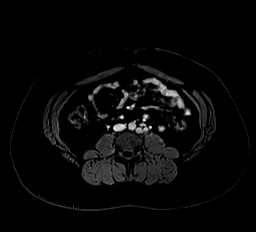
[im 20/80]
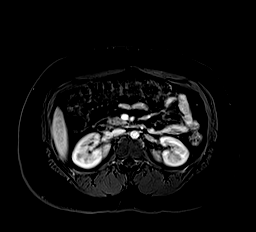
[im 40/80]
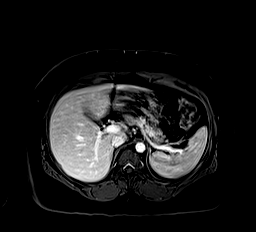
[im 60/80]
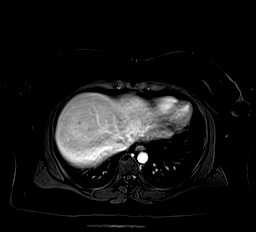
[im 80/80]
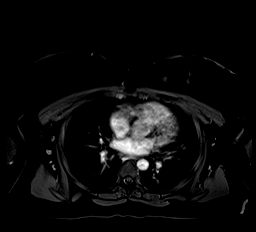

[Series 22: t1_vibe_axial_(person_name)_+c3_w · axial · 3.0mm · 1.56mm/px · z∈[-62,+175]mm · 5 of 80 slices shown]
[im 1/80]
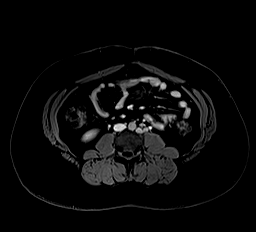
[im 20/80]
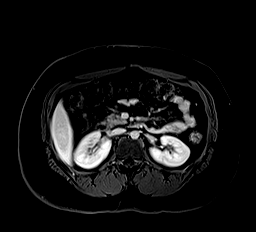
[im 40/80]
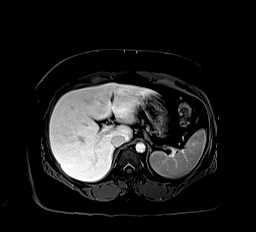
[im 60/80]
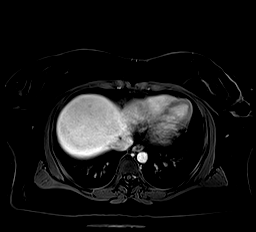
[im 80/80]
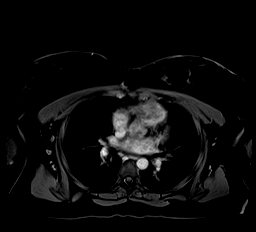

[Series 26: t1_vibe_axial_(person_name)_+c4_w · axial · 3.0mm · 1.56mm/px · z∈[-62,+175]mm · 5 of 80 slices shown]
[im 1/80]
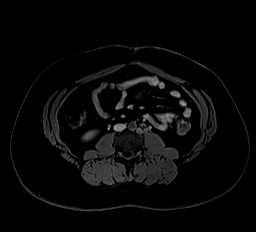
[im 20/80]
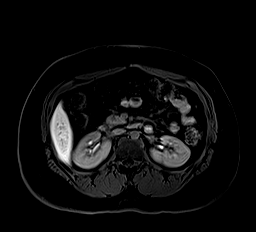
[im 40/80]
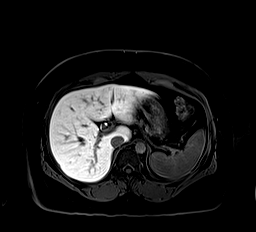
[im 60/80]
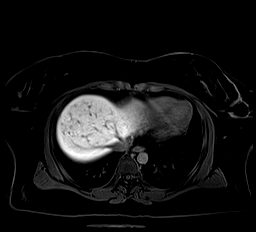
[im 80/80]
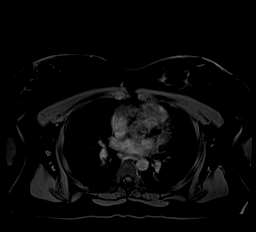

[48 of 48 positions shown; findings below may reference images not displayed]

FINDINGS: As seen on the previous examination, there is an enhancing mass in the anterior segment of the right hepatic lobe, segment VIII, measuring 4.8 x 4.3 cm (series 26, image 23). On precontrast imaging, it is relatively subtle, being slightly decreased signal on T1 and slightly increased signal on T2-weighted sequences compared to the hepatic parenchyma. Post contrast, it demonstrates early arterial phase enhancement with a central nonenhancing focus. It becomes nearly isointense to liver parenchyma on the portal venous and transitional phases. On the hepatobiliary phase, it is iso to slightly hyperintense compared to the hepatic parenchyma. The appearance is most consistent with a focal nodular hyperplasia.

No other hepatic lesions are seen. There is hepatic steatosis with diffuse signal drop through the liver on the out-of-phase sequence. The gallbladder is unremarkable. There is no biliary duct dilatation seen. The spleen, pancreas, adrenal glands and kidneys are unremarkable. Incidental note is made of a fat-containing umbilical hernia.
IMPRESSION: 1.
4.8 x 4.3 cm right lobe hepatic mass demonstrates imaging characteristics most consistent with focal nodular hyperplasia. Consider followup in 6 months to confirm stability.

2.
Hepatic steatosis.

## 2020-12-03 ENCOUNTER — Encounter (INDEPENDENT_AMBULATORY_CARE_PROVIDER_SITE_OTHER): Payer: TRICARE Prime—HMO | Admitting: Internal Medicine

## 2021-06-05 ENCOUNTER — Encounter: Payer: Self-pay | Admitting: Internal Medicine

## 2021-07-10 ENCOUNTER — Encounter (INDEPENDENT_AMBULATORY_CARE_PROVIDER_SITE_OTHER): Payer: Self-pay | Admitting: Hospital

## 2021-08-19 ENCOUNTER — Encounter (INDEPENDENT_AMBULATORY_CARE_PROVIDER_SITE_OTHER): Payer: Self-pay | Admitting: Hospital

## 2021-09-04 IMAGING — MR MRI ABDOMEN WO/W Eovist Contrast
7 of 15 series · 19 of 48 positions shown · IV contrast (10 ml Eovist)
Comparison: MRI abdomen studies dated 12/01/20, 10/01/20.

HISTORY: 30-year-old female with specified diseases of liver.
TECHNIQUE: Pre- and postcontrast MRI study of the abdomen was performed with IV administration of 10 mL of Eovist. The examination was performed using coronal and axial images of varying sequences. 2-D and 3-D MRCP images were also obtained. Post-processing software generated rotating 3-D and MIP images performed under concurrent physician supervision.

[Series 3: t1_fl2d_opp_ph_(person_name) · axial · 6.0mm · 0.68mm/px · 1 of 30 slices shown]
[im 1/30]
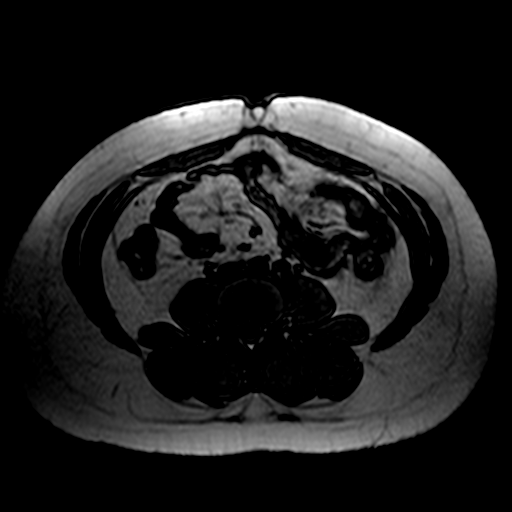

[Series 4: t1_fl2d_in_ph_(person_name) · axial · 6.0mm · 0.68mm/px · 1 of 30 slices shown]
[im 1/30]
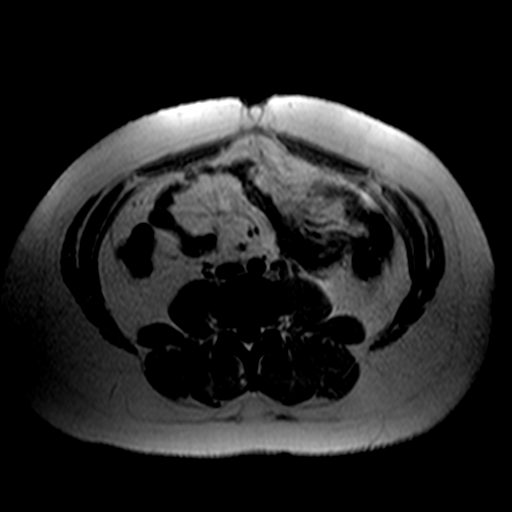

[Series 6: t1_axial_vibe_fs · axial · 3.0mm · 0.68mm/px · z∈[-41,+148]mm · 3 of 64 slices shown]
[im 1/64]
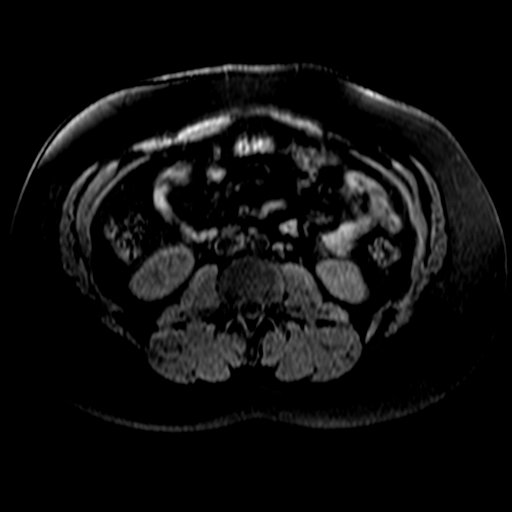
[im 32/64]
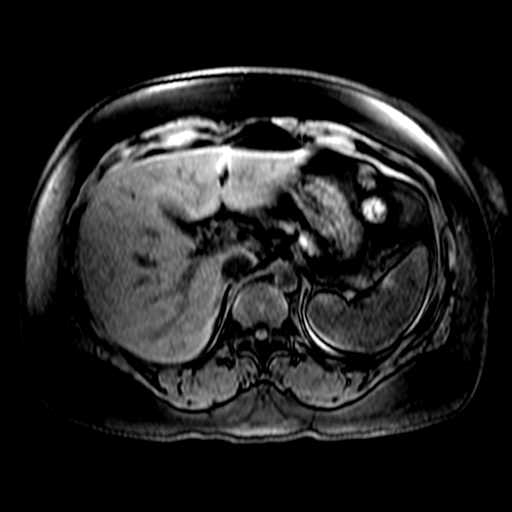
[im 64/64]
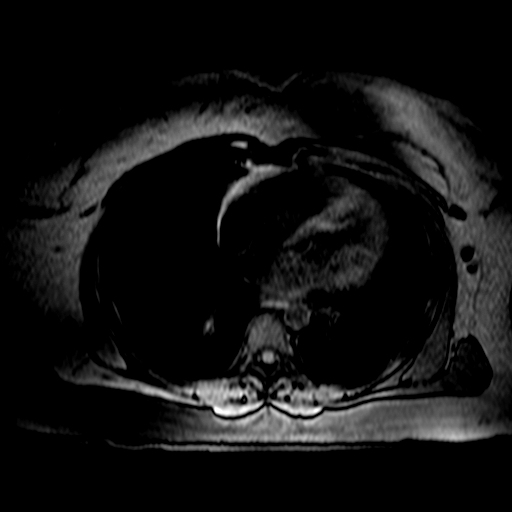

[Series 7: t1_axial_vibe +c1_fs · axial · 3.0mm · 0.68mm/px · z∈[-41,+148]mm · 3 of 64 slices shown]
[im 1/64]
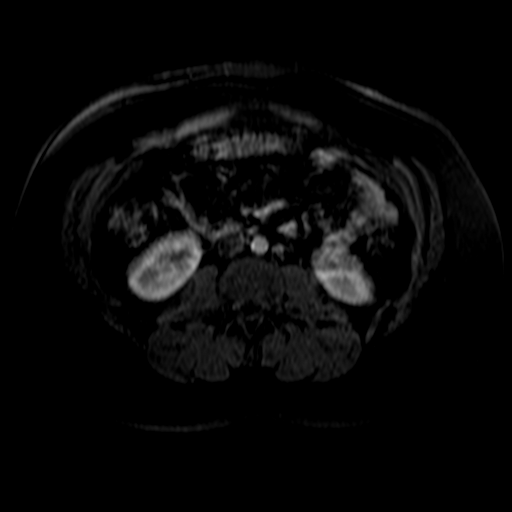
[im 32/64]
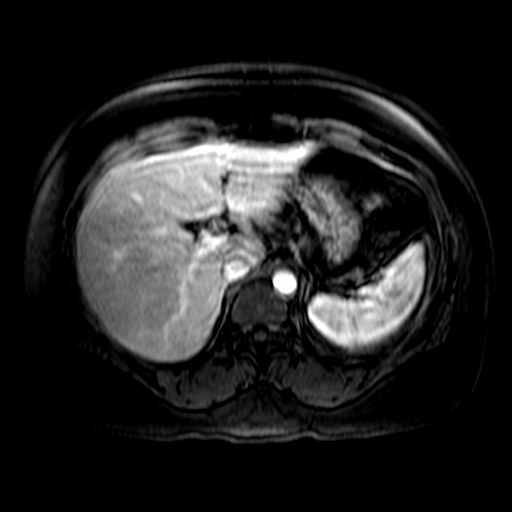
[im 64/64]
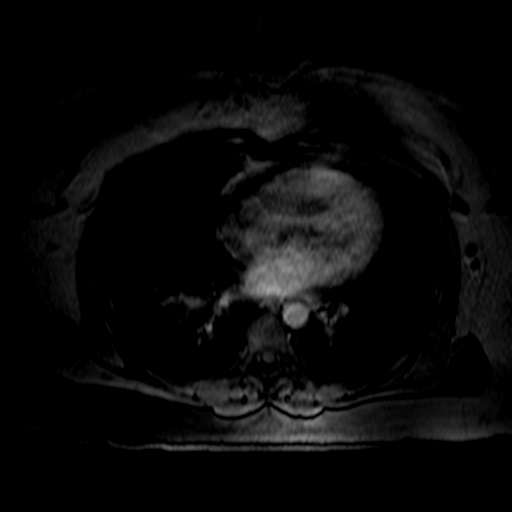

[Series 8: t1_axial_vibe +c1_fs_sub · axial · 3.0mm · 0.68mm/px · z∈[-41,+148]mm · 4 of 64 slices shown]
[im 1/64]
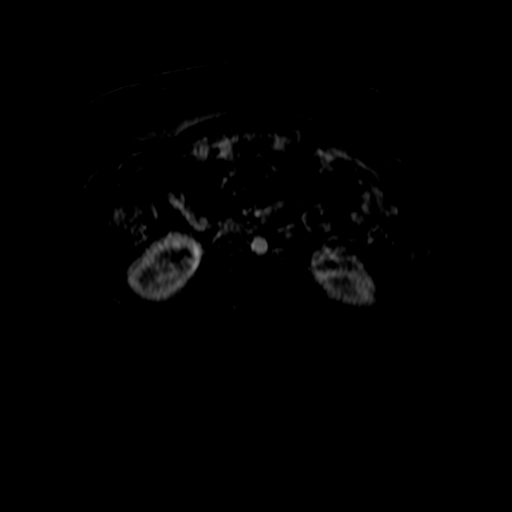
[im 22/64]
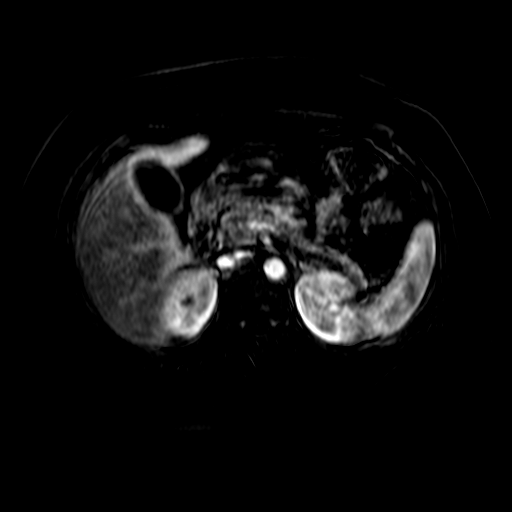
[im 43/64]
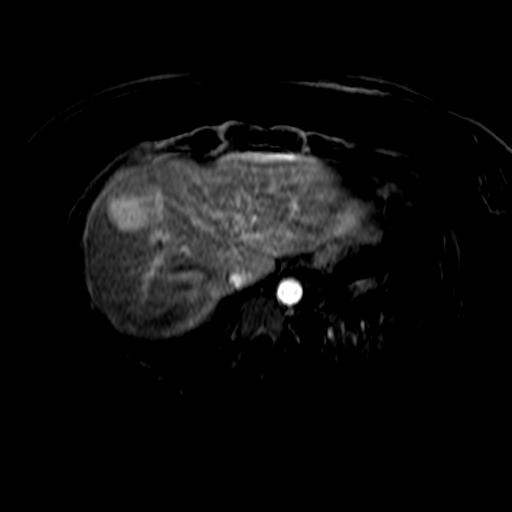
[im 64/64]
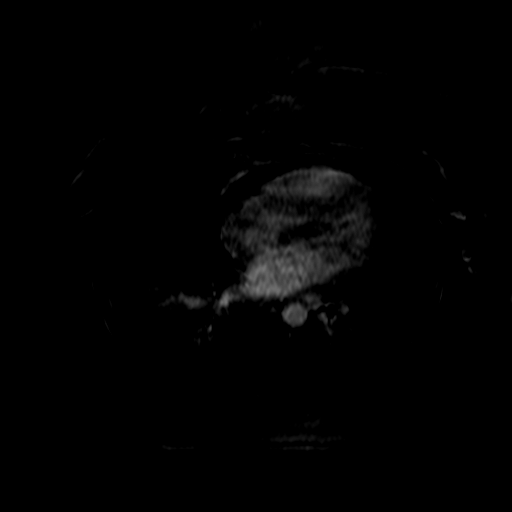

[Series 9: t1_axial_vibe +c2_fs · axial · 3.0mm · 0.68mm/px · z∈[-41,+148]mm · 4 of 64 slices shown]
[im 1/64]
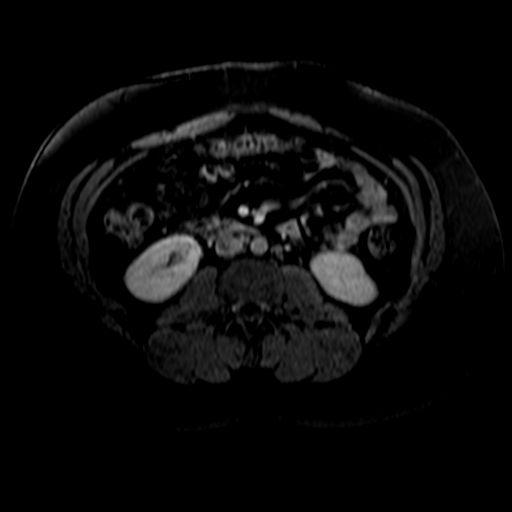
[im 22/64]
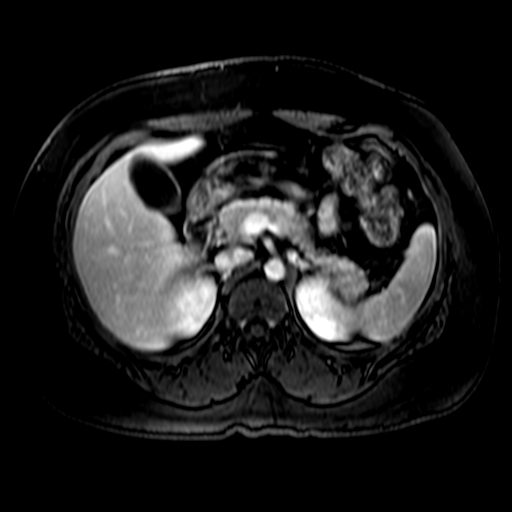
[im 43/64]
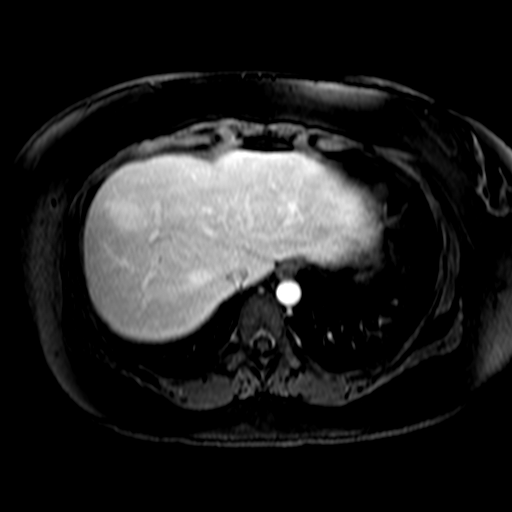
[im 64/64]
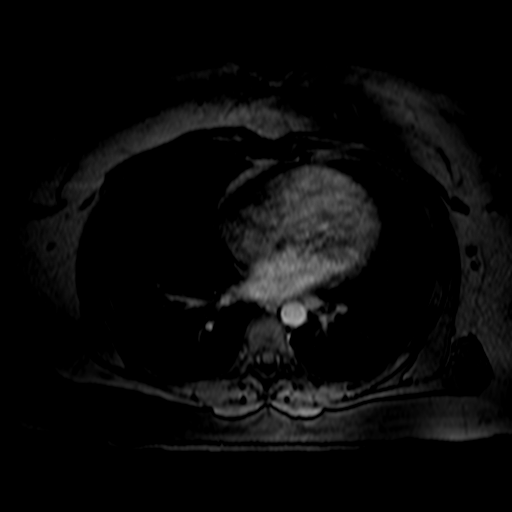

[Series 10: t1_axial_vibe +c2_fs_sub · axial · 3.0mm · 0.68mm/px · z∈[-41,+85]mm · 3 of 64 slices shown]
[im 1/64]
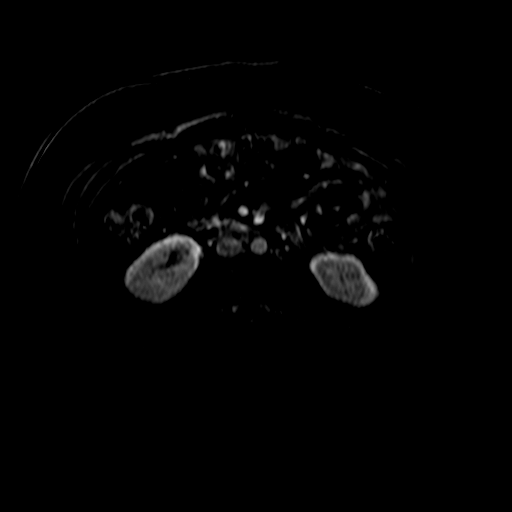
[im 22/64]
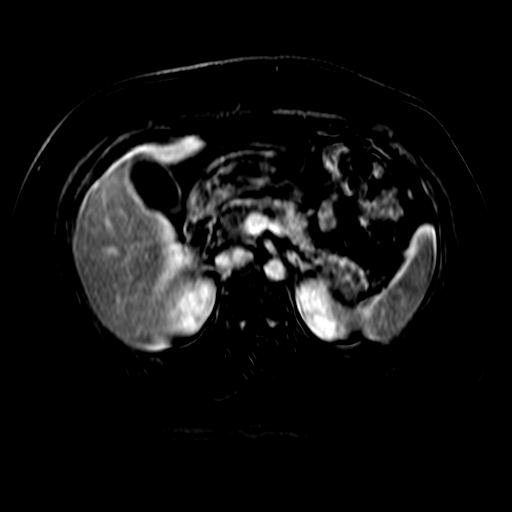
[im 43/64]
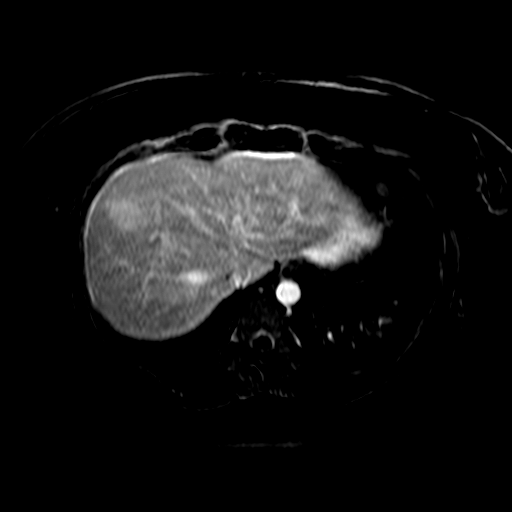

[19 of 48 positions shown; findings below may reference images not displayed]

FINDINGS: Hepatobiliary system: 4.8 x 4.3 cm segment VIII liver mass present. It demonstrates early arterial enhancement and persistent uptake up to the hepatobiliary phase. The signal characteristics are compatible with a focal nodular hyperplasia. There has been no interval change compared to prior examination dating back to 10/01/20. Mild liver steatosis.

Gallbladder: The gallbladder is fluid-filled without radiopaque stones, wall thickening or pericholecystic fluid.

Spleen: No splenomegaly or focal abnormalities.

Stomach: The stomach is unremarkable.

Pancreas: No focal masses. No pancreatic ductal dilatation. No significant peripancreatic inflammatory changes.

Adrenal glands: No focal masses.

Kidneys and collecting system: No hydronephrosis or nephrolithiasis. No discrete masses.

Retrocrural or para-aortic lymphadenopathy: None.

Abdominal wall: The abdominal wall is intact.
IMPRESSION: 1.
4.8 x 4.3 cm segment VIII liver mass with signal characteristics compatible with a focal nodular hyperplasia. No interval change in size.

2.
Mild liver steatosis.

3.
The remaining upper abdomen is otherwise unremarkable.

## 2022-03-01 ENCOUNTER — Encounter (INDEPENDENT_AMBULATORY_CARE_PROVIDER_SITE_OTHER): Payer: Self-pay | Admitting: Hospital

## 2022-04-13 IMAGING — US US LIVER
1 series · 14 of 25 positions shown · non-contrast
Comparison: none

[Series 1: us liver · 14 of 48 slices shown]
[im 1/48]
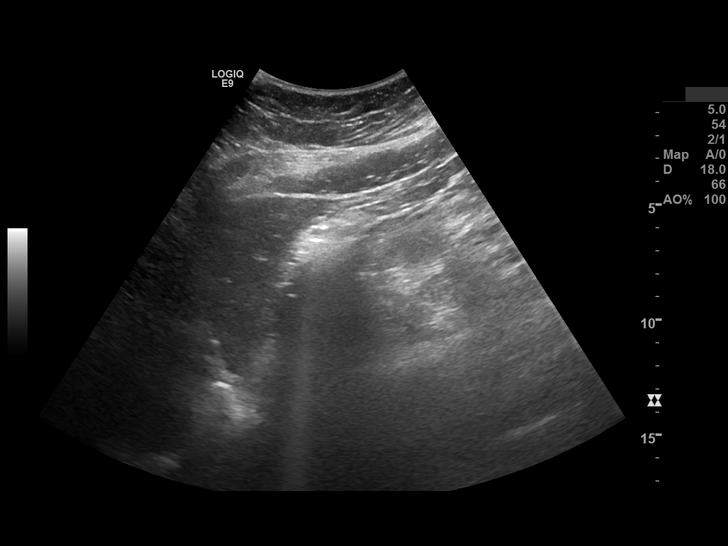
[im 4/48]
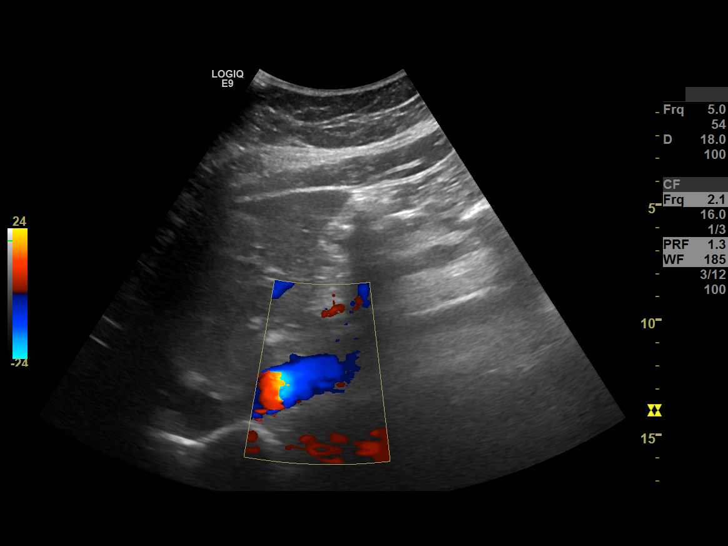
[im 8/48]
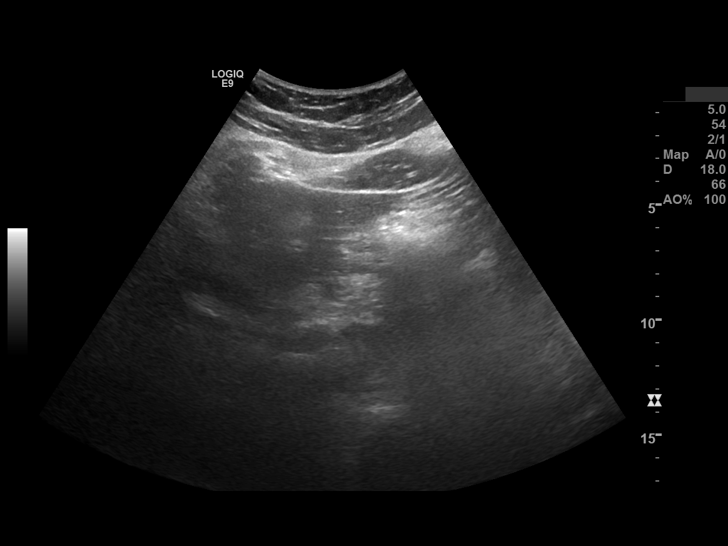
[im 12/48]
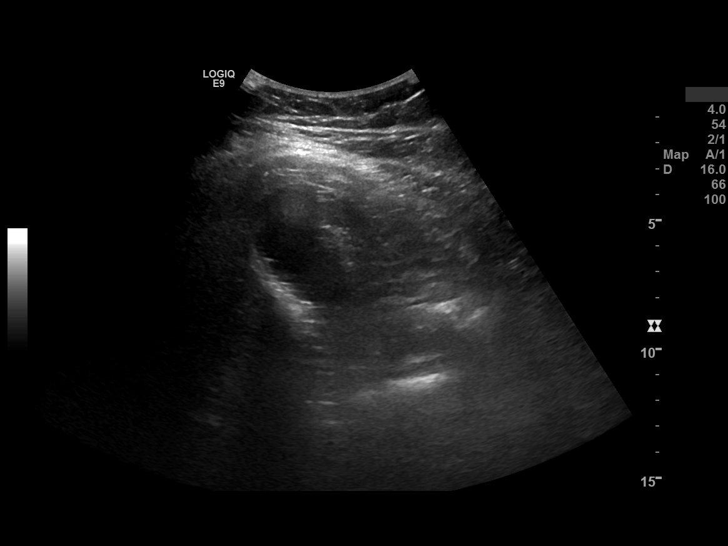
[im 16/48]
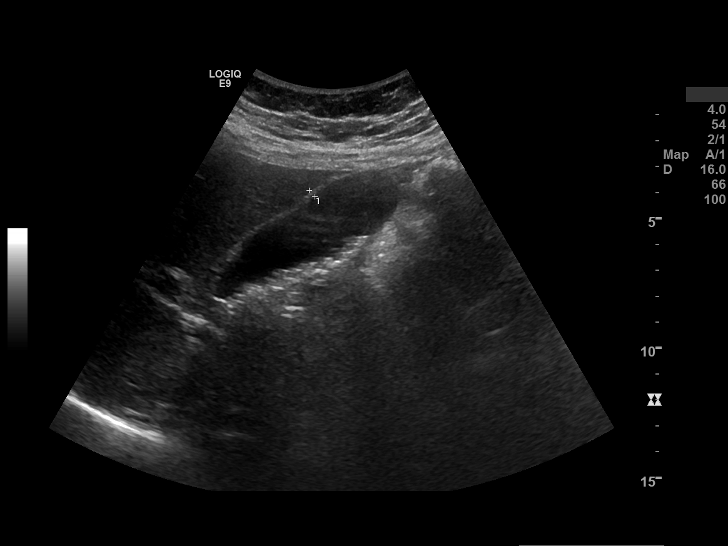
[im 18/48]
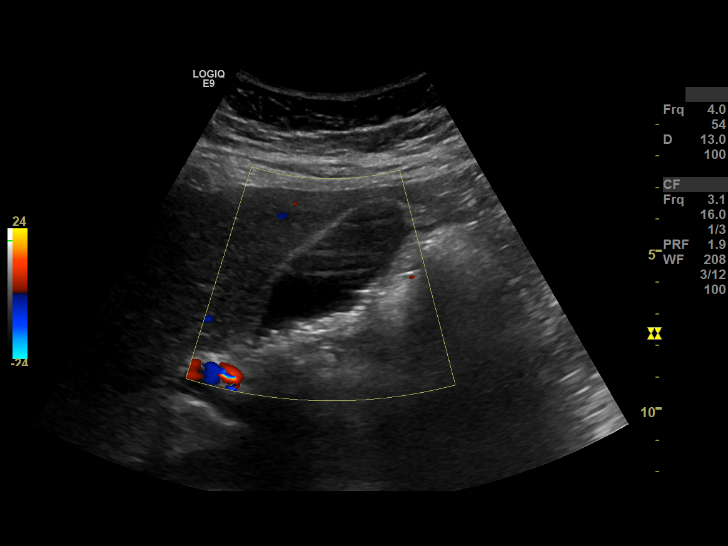
[im 22/48]
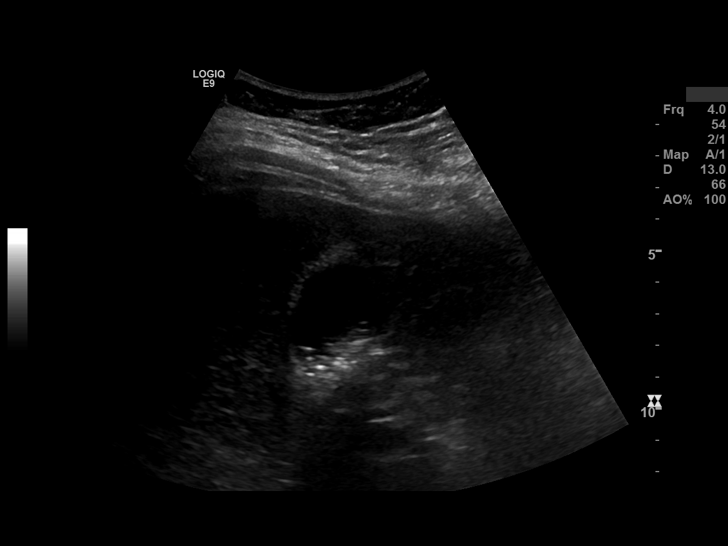
[im 26/48]
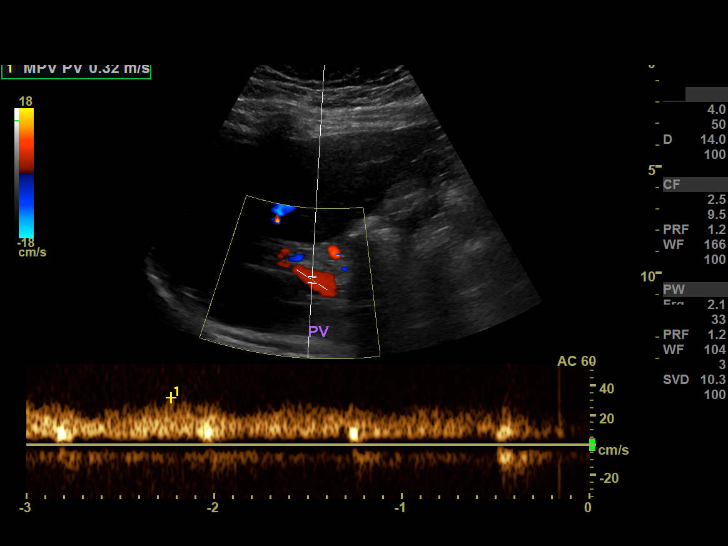
[im 30/48]
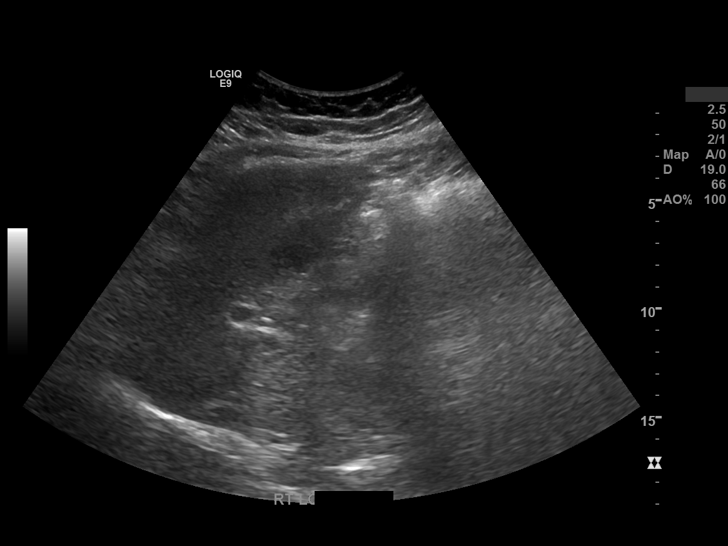
[im 32/48]
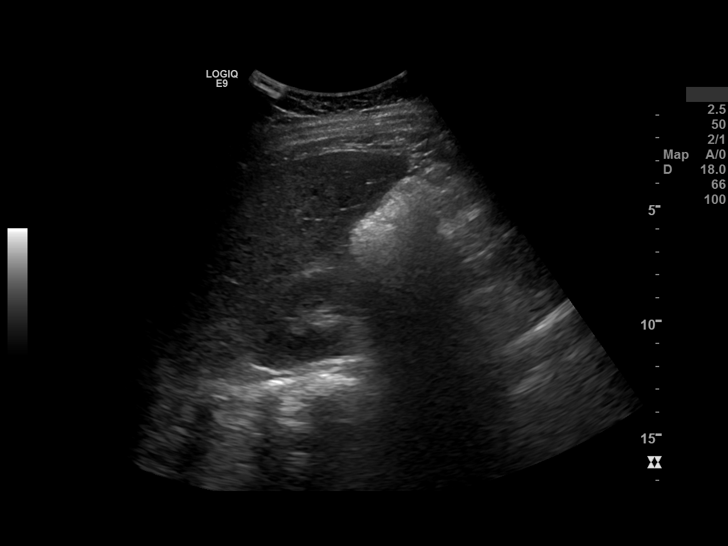
[im 36/48]
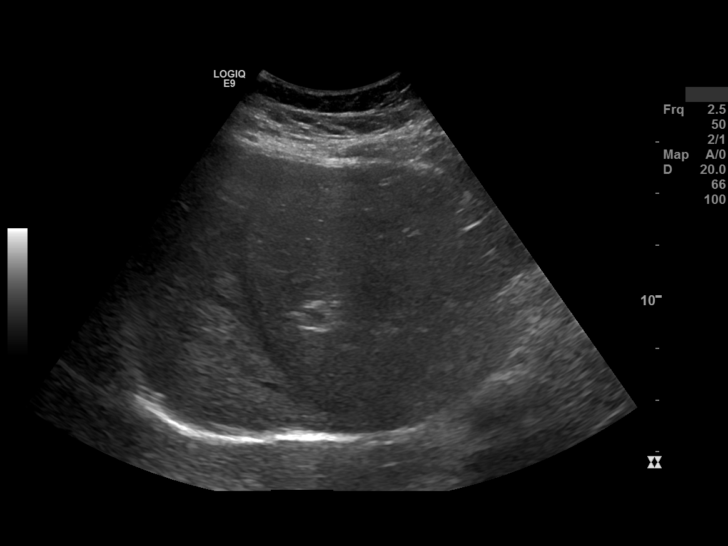
[im 40/48]
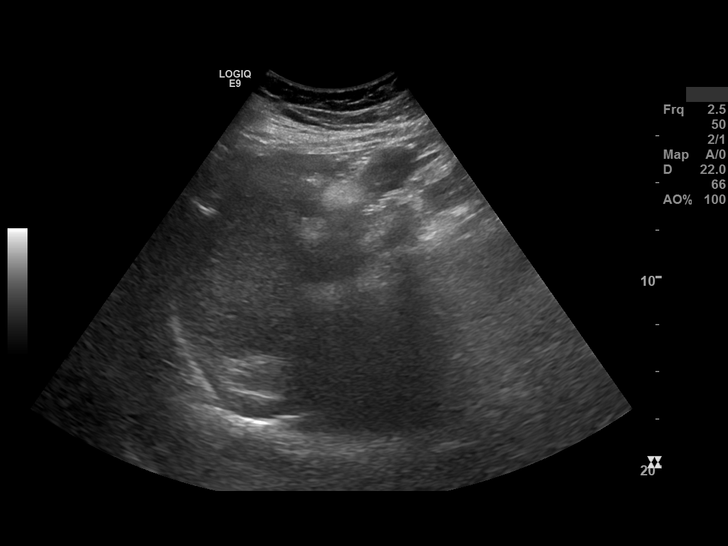
[im 44/48]
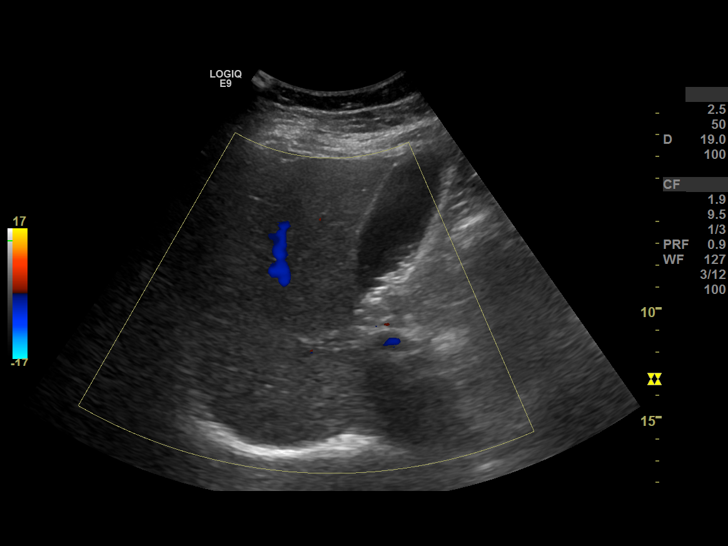
[im 48/48]
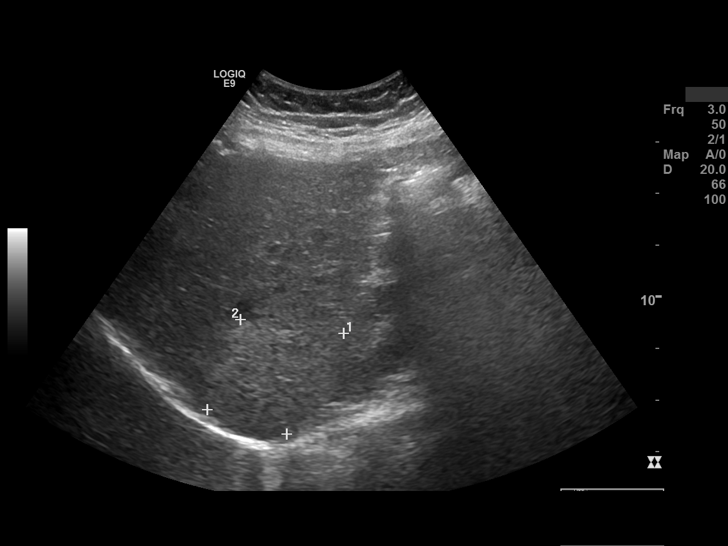

[14 of 25 positions shown; findings below may reference images not displayed]

Images Obtained from [facility]
REASON FOR EXAM
*  Other specified diseases of liver, fatty change of liver, focal nodular hyperplasia
COMPARISON
*  MR abdomen 09/04/2021
*  MR abdomen 12/01/2020
TECHNIQUE
*  Multiple gray-scale and color flow images of the right upper quadrant were obtained with ultrasound
FINDINGS
Liver
*  147 mm
*  Normal morphology
*  Normal echogenicity
*  Minimally echogenic lesion in the right lobe at the dome 75 x 60 x 66 mm
Gallbladder
*  Multiple mobile stones
*  Normally distended
*  Wall thickness 3.2 mm
*  No pericholecystic fluid
*  No sonographic Murphy sign
Common Bile Duct
*  Diameter 4.08 mm
Other
*  Hepatopetal flow in the portal vein
*  No ascites
IMPRESSION
*  Known focal nodular hyperplasia at the hepatic dome is visible on ultrasound. Measurements obtained on today's exam are larger than previous studies; however, size comparison is markedly limited
by the use of differing modalities
*  Cholelithiasis

## 2022-10-21 ENCOUNTER — Encounter (INDEPENDENT_AMBULATORY_CARE_PROVIDER_SITE_OTHER): Payer: Self-pay | Admitting: Hospital

## 2024-05-16 ENCOUNTER — Encounter (INDEPENDENT_AMBULATORY_CARE_PROVIDER_SITE_OTHER): Payer: Self-pay | Admitting: Hospital

## 2024-05-16 ENCOUNTER — Ambulatory Visit (INDEPENDENT_AMBULATORY_CARE_PROVIDER_SITE_OTHER)

## 2024-05-16 ENCOUNTER — Encounter (INDEPENDENT_AMBULATORY_CARE_PROVIDER_SITE_OTHER): Payer: Self-pay

## 2024-05-16 NOTE — Addendum Note (Signed)
 Addended by: Mervyn Pflaum on: 05/16/2024 08:55 AM     Modules accepted: Orders

## 2024-05-16 NOTE — Progress Notes (Signed)
 [facility] Outpatient Note    HPI  Autumn Vincent is a 33 year old female who presents to clinic for Establish Care     Patient is a female with a history of hypothyroidism, presenting to establish care. Her last annual exam was approximately 2 years ago.    The patient recently returned to the [place]  in May after living in [place]. She reports a history of hypothyroidism diagnosed in 2021, for which she continues to take levothyroxine 50 micrograms daily. She also has a history of liver growths detected in 2021, which were non-cancerous. Her last scan for these growths was 3 years ago, and she was unable to see a specialist in [place] as planned due to time constraints.    The patient discloses a history of anemia during pregnancy and mentions being prone to blood clots during pregnancy due to being told she had Sjogrens. Was told she might require blood thinners if she becomes pregnant again. She also reports a possible autoimmune condition related to blood clots. Discussed autoimmune testing.    Additionally, she has a history of general herpes, though she reports no recent outbreaks.    Medical History  - Hypothyroidism diagnosed in 2021  - History of anemia during pregnancy  - Non-cancerous liver growths detected in 2021  - Obesity  - History of general herpes  - Vapes nicotine  - Alcohol use    Surgical History  - Tummy tuck and breast reduction    Medications and Supplements  - Levothyroxine 50 micrograms by mouth daily    Family History  - Maternal grandmother: Hypertension, heart disease requiring pacemaker    Social History  - Substance Use: Uses electronic cigarettes with nicotine almost daily; drinks alcohol on weekends, about 4 drinks, less than monthly has 6 drinks  - Living Situation: Lives with husband, daughter and 2 older sons  - Marital Status: Married. Feels safe at home  - Children: 2 older sons in school  - Occupation: Patient braids hair.       PHQ:      11/27/2020    11:08 AM  02/13/2018     2:39 PM 09/15/2017    10:17 AM   FM PHQ2   PHQ2 Total 0 0 0            REVIEW OF SYSTEMS:  Review of Systems  Negative except for those in HPI    PAST MEDICAL AND SURGICAL HISTORY:    has a past surgical history that includes breast reduction (2017) and tummy tuck (2017).  Problem List[1]      CURRENT MEDICATIONS:  Current Medications[2]     ALLERGIES:  Patient has no known allergies.    FAMILY HISTORY:  family history includes Heart Disease in her m grandmother; Hypertension in her m grandmother; No Known Problems in her father, m grandfather, mother, p grandfather, and p grandmother.    SOCIAL HISTORY:     none     Socioeconomic History    Marital status: Married   Tobacco Use    Smoking status: Some Days     Types: Electronic Cigarette (Smokeless)    Smokeless tobacco: Never   Vaping Use    Vaping status: Every Day    Substances: Nicotine   Substance and Sexual Activity    Alcohol use: Yes     Alcohol/week: 4.0 standard drinks of alcohol     Types: 4 Shots of liquor per week    Drug use: No  Sexual activity: Not Currently     Partners: Male     Birth control/protection: Condom   Social History Narrative    Married    2 children        PHYSICAL EXAM   05/16/24  0821 05/16/24  0824   BP: (!) 112/91 122/90   Pulse: 83    Temp: 97.3 F (36.3 C)    Resp: 16    SpO2: 98%      Body mass index is 33.99 kg/m.  Wt Readings from Last 5 Encounters:   05/16/24 89.8 kg (198 lb)   02/22/19 86 kg (189 lb 11.2 oz)   01/25/19 85.7 kg (188 lb 14.4 oz)   01/15/19 86.1 kg (189 lb 12.8 oz)   10/03/18 83.9 kg (185 lb)     Blood Pressure   05/16/24 122/90   02/22/19 112/64   01/25/19 91/68   01/15/19 115/76   10/03/18 121/65     Physical Exam  GEN: in NAD, A&O, pleasant, non-toxic appearing  EENT: No injection or icterus.   Neurologic: Mentation appropriate. No facial droop.  No tremor.  Psych: Alert and Oriented. Affect appropriate.  Skin: exposed areas free of rash and no significant lesions    ASSESSMENT AND PLAN  1.  Encounter to establish care  History reviewed and chart updated    2. Screening for cardiovascular condition  - Lipid Panel Green Plasma Separator Tube  - Comprehensive Metabolic Panel  - Hemogram Lavender    3. Need for hepatitis C screening test  - Hep C Reflex    4. Need for hepatitis B screening test  - hep B surface ag  - hep B surface antibody quant  - Hepatitis B Core Antibody Total    5. Class 1 obesity  BMI of 33.99. Would like to connect with weight management.   - [facility]  - Lipid Panel Green Plasma Separator Tube  - Comprehensive Metabolic Panel  - Glycosylated Hgb(A1C), Blood Lavender  - TSH Reflex to T4 - [facility]    6. Hypothyroidism, unspecified type  Hx of hypothyroidism. Takes levothyroxine 50mcg daily  - TSH Reflex to T4 - [facility]    7. History of anemia  - Hemogram Lavender    8. Focal nodular hyperplasia of liver  Will get us  and refer to hepatology for follow up.   - Ultrasound Liver  - Consult/Referral to Liver Clinic    9. Hx of Sjogren's disease  The patient reports that she was told she had Sjogren's. Reports being told she had an autoimmune condition that would increase her risk of blood clots. Check labs below. Consider rheumatology/hematology referral.   - Antinuclear Antibodies (ANA) w/ Reflex  - Sjogren'S Antibodies (Ss-A,Ss-B)  - Lupus anticoagulant    10. Alcohol use  Patient reports drinking alcohol on weekends, consuming about 4 drinks at a time (around 8 weekly total). Less than monthly, she consumes 6 drinks at a time.  Plan:  - Provided education on safe alcohol consumption limits and encouraged reduction of alcohol use    11. Vapes nicotine containing substance   Patient reports using electronic cigarettes with nicotine almost daily.  Plan:  - Counseled on smoking cessation    12. Elevated blood pressure reading in office without diagnosis of hypertension  Diastolic BP elevated. Discussed limiting salt. Exercise and diet information given with AVS. Recommend  following up for BP check in 3 months.     Delaney Schnick, MD          [  1]   Patient Active Problem List  Diagnosis    History of herpes genitalis    Screened positive for possible risky alcohol use (AUDIT score of 7-15)    Class 1 obesity    Focal nodular hyperplasia of liver    Hypothyroid    History of anemia   [2]   Current Outpatient Medications:     levothyroxine (SYNTHROID) 50 MCG tablet, Take 1 tablet (50 mcg) by mouth., Disp: , Rfl:

## 2024-05-17 ENCOUNTER — Encounter (INDEPENDENT_AMBULATORY_CARE_PROVIDER_SITE_OTHER): Payer: Self-pay | Admitting: Physician Assistant

## 2024-05-17 ENCOUNTER — Telehealth: Admitting: Physician Assistant

## 2024-05-17 LAB — COMPREHENSIVE METABOLIC PANEL, BLOOD
ALT (SGPT): 48 IU/L — ABNORMAL HIGH (ref 0–32)
AST: 38 IU/L (ref 0–40)
Albumin: 4.3 g/dL (ref 4.0–5.0)
Alkaline Phos: 71 IU/L (ref 44–121)
BUN/Creatinine Ratio: 13 (ref 9–23)
BUN: 11 mg/dL (ref 6–20)
Bilirubin, Total: 0.6 mg/dL (ref 0.0–1.2)
Calcium: 8.9 mg/dL (ref 8.7–10.2)
Carbon Dioxide: 19 mmol/L — ABNORMAL LOW (ref 20–29)
Chloride: 105 mmol/L (ref 96–106)
Creatinine: 0.82 mg/dL (ref 0.57–1.00)
EGFR: 99 mL/min/1.73 (ref >59)
Globulin, Total: 3.1 g/dL (ref 1.5–4.5)
Glucose: 82 mg/dL (ref 70–99)
Potassium: 4.2 mmol/L (ref 3.5–5.2)
Protein, Total, Serum: 7.4 g/dL (ref 6.0–8.5)
Sodium: 137 mmol/L (ref 134–144)

## 2024-05-17 LAB — LIPID(CHOL FRACT) PANEL, BLOOD
Cholesterol: 135 mg/dL (ref 100–199)
HDL Cholesterol: 55 mg/dL (ref >39)
LDL Chol CAL (NIH) - LABCORP: 70 mg/dL (ref 0–99)
Non-HDL Cholesterol: 80 mg/dL (ref 0–129)
Triglycerides: 45 mg/dL (ref 0–149)
VLDL Cholesterol CAL: 10 mg/dL (ref 5–40)

## 2024-05-17 LAB — HEMOGRAM, BLOOD
Hematocrit: 39.3 % (ref 34.0–46.6)
Hemoglobin: 12.9 g/dL (ref 11.1–15.9)
MCH: 30.1 pg (ref 26.6–33.0)
MCHC: 32.8 g/dL (ref 31.5–35.7)
MCV: 92 fL (ref 79–97)
Platelets: 312 x10E3/uL (ref 150–450)
RBC: 4.28 x10E6/uL (ref 3.77–5.28)
RDW: 12.7 % (ref 11.7–15.4)
WBC: 3.4 x10E3/uL (ref 3.4–10.8)

## 2024-05-17 LAB — FERRITIN, BLOOD: Ferritin: 88 ng/mL (ref 15–150)

## 2024-05-17 LAB — ENA+DNA/DS+SJORGEN'S -LABCORP
Anti-DNA (DS) Ab Qn: 1 [IU]/mL (ref 0–9)
RNP Antibodies: 0.4 AI (ref 0.0–0.9)
Smith Antibodies: <0.2 AI (ref 0.0–0.9)

## 2024-05-17 LAB — ANA W/REFLEX TO DSDNA, RNP, SM, SS-A, SS-B - LABCORP: ANA Direct: POSITIVE — AB

## 2024-05-17 LAB — HEPATITIS C AB, BLOOD: Hep C Virus Ab: NONREACTIVE

## 2024-05-17 LAB — HEPATITIS B SURFACE AB, QUANT, BLOOD: Hep B Surface Ab Quant: 195 m[IU]/mL

## 2024-05-17 LAB — TSH REFLEX TO T4 - LABCORP: TSH: 2.53 u[IU]/mL (ref 0.450–4.500)

## 2024-05-17 LAB — HEPATITIS B CORE AB TOTAL: Hep B Core Ab, Tot: NEGATIVE

## 2024-05-17 LAB — HEPATITIS B SURFACE AG, BLOOD: HBsAg Screen: NEGATIVE

## 2024-05-17 LAB — GLYCOSYLATED HGB(A1C), BLOOD: Hemoglobin A1c: 5.2 % (ref 4.8–5.6)

## 2024-05-17 LAB — SJOGREN'S ANTIBODIES (SSA,SSB)
Sjogren's Anti-SS-A: 7.5 AI — ABNORMAL HIGH (ref 0.0–0.9)
Sjogren's Anti-SS-B: <0.2 AI (ref 0.0–0.9)

## 2024-05-17 MED ORDER — PHENTERMINE HCL 15 MG OR CAPS
15.0000 mg | ORAL_CAPSULE | Freq: Every morning | ORAL | 0 refills | Status: AC
Start: 2024-05-17 — End: ?

## 2024-05-17 MED ORDER — PHENTERMINE HCL 15 MG OR CAPS
15.0000 mg | ORAL_CAPSULE | Freq: Every morning | ORAL | 0 refills | Status: DC
Start: 2024-05-17 — End: 2024-05-17

## 2024-05-17 NOTE — Progress Notes (Signed)
 VIDEO VISIT: Patient consented to this service being conducted by interactive realtime audio and video communication.      HPI  Autumn Vincent is a 33 year old female being seen virtually today for the [facility].  Patient's current BMI is Body mass index is 34.16 kg/m. , categorizing them as obese .  Patient has a goal of achieving a normal BMI [18.5-24.9] to improve quality of life and reduce obesity-related complications.  Patient currently has no co-morbidities.  Patient has attempted food intake and exercise change in the past.      Moved to [place]  during COVID, got up to 210 lbs, was trying to intermittent fast, exercising.  Seen at clinic in [facility] and was diagnosed with hypothyroid medication, started on levothyroxine 50mcg and was able to lose 10 lbs and got down to 200 lbs.  Active but doesn't exercise otherwise.     Has been fluctuating around 200 lbs for the past 3 years and wondering what she can do to get to healthier weight.      Now back in [place]  and getting settled.      Has struggled her whole life with losing weight and has always trying.    Has worked with nutritionist in the past and focused on diet, without results.    Has never taken a weight loss medication in the past.    Patient does feel like when she gets to a certain weight, she is able to stay there.    Patient was 155 lbs with her first child, 175 lbs with second, third pregnancy 200 lbs and body has stayed at all these weights.      PHYSICAL EXAM    05/16/24  0821 05/16/24  0824   BP: (!) 112/91 122/90   Pulse: 83     Temp: 97.3 F (36.3 C)     Resp: 16     SpO2: 98%        Body mass index is 33.99 kg/m.      Wt Readings from Last 5 Encounters:   05/16/24 89.8 kg (198 lb)   02/22/19 86 kg (189 lb 11.2 oz)   01/25/19 85.7 kg (188 lb 14.4 oz)   01/15/19 86.1 kg (189 lb 12.8 oz)   10/03/18 83.9 kg (185 lb)       Denies   History of gallstones or pancreatitis  Hypersensitivity to drug or  ingredient  Pregnancy, planning to get pregnant, or breastfeeding, not currently on OCP (does strange things to her body), using condoms   Personal or family history of medullary thyroid cancer  Multiple endocrine neoplasia syndrome type 2  Suicidal ideation or history of suicidal ideation/attempt     No history of elevated HR, did have elevated BP in office yesterday 122/90 but previously WNL.      Current Food Intake:    Estimated calories per day and/d food intake details: Trying to intermittent fast and eat health     Current Exercise:   Amount per week and type: Active during lifestyle     Goal:   170 lbs   Normal BMI [18.5-24.9]    Recent weights [Today's weight self reported per patient]:  Wt Readings from Last 3 Encounters:   05/17/24 90.3 kg (199 lb)   05/16/24 89.8 kg (198 lb)   02/22/19 86 kg (189 lb 11.2 oz)       Current BMI:  Body mass index is 34.16 kg/m.    Primary  Care Provider: Merlinda Grate      PAST MEDICAL HISTORY:  Past Medical History[1]    PAST SURGICAL HISTORY:   has a past surgical history that includes breast reduction (2017) and tummy tuck (2017).    CURRENT MEDICATIONS:   Current Medications[2]     ALLERGIES:   Patient has no known allergies.     FAMILY HISTORY:   family history includes Heart Disease in her m grandmother; Hypertension in her m grandmother; No Known Problems in her father, m grandfather, mother, p grandfather, and p grandmother.     SOCIAL HISTORY:    reports that she has been smoking electronic cigarette (smokeless). She has never used smokeless tobacco. She reports current alcohol use of about 4.0 standard drinks of alcohol per week. She reports that she does not use drugs.      ROS:  Pertinent positives and negatives noted in HPI, otherwise reviewed and negative.    VITALS:  [Today's height/weight self reported per patient]  Vitals:    05/17/24 0836   Weight: 90.3 kg (199 lb)   Height: 5' 4 (1.626 m)     Blood Pressure   05/16/24 122/90   02/22/19 112/64    01/25/19 91/68     Pulse Readings from Last 3 Encounters:   05/16/24 83   02/22/19 85   01/25/19 88     The ASCVD Risk score (Arnett DK, et al., 2019) failed to calculate for the following reasons:    The 2019 ASCVD risk score is only valid for ages 75 to 20    PERTINENT LABS:  A1C: No results found for: A1C  LDL:No results found for: LDLCHOLCALNI, LDLCALC    TSH: No results found for: TSH       PHYSICAL EXAMINATION [exam limited due to virtual assessment]:  Physical Exam  General: Respiratory rate WNL, no acute distress, sitting comfortably  Head: Normocephalic, atraumatic  Eyes: EOMI, no eye injection, no eyelid swelling, no icterus  EENT: Mucous membranes moist, no visible lip cracking, no active nasal drainage  Neck: Supple, no visible goiter  Respiratory: No retractions, no nasal flaring, normal overall work of breathing  Cardio: No cyanosis  Skin: No visible rash or lesions, hair normal appearance   Psych: Normal affect, mood, and speech  Neuro: CN II-XII grossly intact      ASSESSMENT AND PLAN:  Autumn Vincent is a 33 year old female obese with no co-morbidities who would benefit from weight loss for both quality of life and current/potential weight-related co-morbidities, with a goal of achieving a normal BMI to improve quality of life and decrease obesity-related complications.      Starting weight/BMI:   Wt Readings from Last 1 Encounters:   05/17/24 90.3 kg (199 lb)   , Body mass index is 34.16 kg/m.    1. Hypothyroidism, unspecified type  - Continue levothyroxine 50mcg.   - Managed per PCP.   - Discussed implementing lifestyle modifications to improve condition, including healthy diet, regular exercise, and/or behavior modification.      2. Class 1 obesity without serious comorbidity with body mass index (BMI) of 34.0 to 34.9 in adult, unspecified obesity type  - Counseled on options, including medication and lifestyle.   - Patient has tried for years to adjust lifestyle and has met  with nutritionists in the past.   - Would like to take medication to get down to healthier weight and establish new set point.   - Will start with phentermine , counseled  on MOA and side effects.   - Monitor HR and blood pressure.   - Protein and strength training.   - 100g protein per day goal.   - Follow-up in 3 weeks, appt made.   - Labs obtained yesterday but results not yet available for review, will keep an eye out and review at follow-up visit.    - Discussed implementing lifestyle modifications to improve condition, including healthy diet, regular exercise, and/or behavior modification.    - phentermine  15 MG capsule; Take 1 capsule (15 mg) by mouth every morning.  Dispense: 30 capsule; Refill: 0      Medications: Options, side effects, and contraindications discussed with patient.    Priority: Encouraged patient to feel confident in who they are and to prioritize goals of health in order to improve quality of life and decrease obesity-related disease, rather than achieving a scale number or look.  Consider mental and emotional health throughout this process and always report any concerns.      Goal: Discussed current BMI and goal BMI between 18.5 and 24.9 and achieving 5-10% loss of current body weight.     Food intake: Reviewed the concept of daily output exceeding daily input.  Healthy food intake discussed and encouraged VEGETABLES and healthy proteins, minimizing sugars and carbohydrates.  Plenty of fluids.  Avoid sugary drinks.  DAILY FOOD JOURNAL (MyFitnessPal is a Garment/textile technologist for determining calories).        Exercise: Encouraged regular exercise, starting slow and increasing over time.  Goal of 30+ minutes 5-7 days per week, including both aerobic and resistance exercise.     Behavior Modification: Recommended daily food journal (can use PocketDoc as a tracking resource, app e.g. MyFitnessPal, Calorie Counter - MyNetDiary, or notebook), self-monitoring with self-weighing daily or twice weekly.   Encouraged long-term modification of food intake, physical activity, and behavior.  Counseling and self-help groups have proven to be helpful in this process.      Follow-up: Weekly for the first month, twice monthly during months 2-3, monthly thereafter until goals are achieved, and then as needed.  Required to be under the care of a Primary Care Provider, with this program supplementing current medical care.  Recommend letting Primary Care Provider know patient is in Weight Management Program and about any new prescriptions prescribed.    Resources: PocketDoc has tools including weight, blood pressure, food, fluids logs as well as appointment availability with our Nutritionists and/or Mental Health Specialists.  Using these resources is highly recommended as an adjunct to the plan of care determined with your clinician.        FOLLOW UP  No follow-ups on file.        Nat Berlin, PA-C         [1]   Past Medical History:  Diagnosis Date    Anemia complicating pregnancy, second trimester 02/13/2018    Anemia complicating pregnancy, second trimester 02/13/2018    BV (bacterial vaginosis) 10/03/2018    Genital herpes     Hypothyroidism     UTI (urinary tract infection)    [2]   Current Outpatient Medications:     levothyroxine (SYNTHROID) 50 MCG tablet, Take 1 tablet (50 mcg) by mouth., Disp: , Rfl:     phentermine  15 MG capsule, Take 1 capsule (15 mg) by mouth every morning., Disp: 30 capsule, Rfl: 0

## 2024-05-17 NOTE — Addendum Note (Signed)
 Addended by: JANEL GARRE on: 05/17/2024 11:37 AM     Modules accepted: Orders

## 2024-05-18 ENCOUNTER — Encounter (INDEPENDENT_AMBULATORY_CARE_PROVIDER_SITE_OTHER): Payer: Self-pay

## 2024-05-18 LAB — LUPUS ANTICOAGULANT COMPREHENSIVE-LABCORP
Dilute Prothrombin Time(dPT): 31.4 s (ref 0.0–47.6)
Drvvt: 32.2 s (ref 0.0–47.0)
PTT-LA: 35.1 s (ref 0.0–43.5)
Thrombin Time: 19 s (ref 0.0–23.0)
dPT Confirm Ratio: 1.08 ratio (ref 0.00–1.34)

## 2024-05-21 ENCOUNTER — Ambulatory Visit (INDEPENDENT_AMBULATORY_CARE_PROVIDER_SITE_OTHER): Payer: Self-pay

## 2024-05-21 DIAGNOSIS — R768 Other specified abnormal immunological findings in serum: Secondary | ICD-10-CM

## 2024-05-21 DIAGNOSIS — R7401 Elevation of levels of liver transaminase levels: Secondary | ICD-10-CM

## 2024-05-21 DIAGNOSIS — K8020 Calculus of gallbladder without cholecystitis without obstruction: Secondary | ICD-10-CM

## 2024-05-21 DIAGNOSIS — K760 Fatty (change of) liver, not elsewhere classified: Secondary | ICD-10-CM

## 2024-05-28 ENCOUNTER — Ambulatory Visit (INDEPENDENT_AMBULATORY_CARE_PROVIDER_SITE_OTHER)

## 2024-05-28 ENCOUNTER — Telehealth (INDEPENDENT_AMBULATORY_CARE_PROVIDER_SITE_OTHER): Payer: Self-pay

## 2024-05-28 ENCOUNTER — Encounter (INDEPENDENT_AMBULATORY_CARE_PROVIDER_SITE_OTHER): Payer: Self-pay

## 2024-05-28 VITALS — BP 130/94 | HR 80 | Temp 98.1°F | Resp 20 | Ht 64.0 in | Wt 195.0 lb

## 2024-05-28 NOTE — Telephone Encounter (Signed)
 Called patient about her appointment today if she calls back please transfer her to 5

## 2024-05-28 NOTE — Progress Notes (Signed)
 [facility] Clinic Outpatient Note    HPI  Autumn Vincent is a 33 year old female who presents to clinic for Other (EKG per provider)     A 33 year old female with a history of class one obesity, elevated blood pressure, hypothyroidism, and family history of heart disease presents for an ECG. The patient has been following with the weight management team and was recently prescribed phentermine  15 mg.    The patient is also here for an ECG, which was recommended by the weight management team prior to starting phentermine . She has a history of elevated blood pressure and a family history of heart disease, which are relevant factors in considering the appropriateness of phentermine  for weight management.    PHQ:      11/27/2020    11:08 AM 02/13/2018     2:39 PM 09/15/2017    10:17 AM   FM PHQ2   PHQ2 Total 0 0 0       REVIEW OF SYSTEMS:  Review of Systems  Negative except for those in HPI    PAST MEDICAL AND SURGICAL HISTORY:    has a past surgical history that includes breast reduction (2017) and tummy tuck (2017).  Problem List[1]      CURRENT MEDICATIONS:  Current Medications[2]     ALLERGIES:  Patient has no known allergies.    FAMILY HISTORY:  family history includes Heart Disease in her m grandmother; Hypertension in her m grandmother; No Known Problems in her father, m grandfather, mother, p grandfather, and p grandmother.    SOCIAL HISTORY:     none     Socioeconomic History    Marital status: Married   Tobacco Use    Smoking status: Some Days     Types: Electronic Cigarette (Smokeless)    Smokeless tobacco: Never   Vaping Use    Vaping status: Every Day    Substances: Nicotine   Substance and Sexual Activity    Alcohol use: Yes     Alcohol/week: 4.0 standard drinks of alcohol     Types: 4 Shots of liquor per week    Drug use: No    Sexual activity: Not Currently     Partners: Male     Birth control/protection: Condom   Social History Narrative    Married    2 children        PHYSICAL EXAM   05/28/24  1700  05/28/24  1701   BP: (!) 133/91 (!) 130/94   Pulse: 80    Temp: 98.1 F (36.7 C)    Resp: 20    SpO2: 99%      Body mass index is 33.47 kg/m.  Wt Readings from Last 5 Encounters:   05/28/24 88.5 kg (195 lb)   05/17/24 90.3 kg (199 lb)   05/16/24 89.8 kg (198 lb)   02/22/19 86 kg (189 lb 11.2 oz)   01/25/19 85.7 kg (188 lb 14.4 oz)     Blood Pressure   05/28/24 (!) 130/94   05/16/24 122/90   02/22/19 112/64   01/25/19 91/68   01/15/19 115/76     Physical Exam  Vitals reviewed.   Constitutional:       General: She is not in acute distress.     Appearance: She is obese. She is not ill-appearing, toxic-appearing or diaphoretic.   HENT:      Head: Normocephalic and atraumatic.   Eyes:      General: No scleral icterus.  Right eye: No discharge.         Left eye: No discharge.   Cardiovascular:      Rate and Rhythm: Normal rate.      Heart sounds: No murmur heard.     No friction rub. No gallop.   Pulmonary:      Effort: Pulmonary effort is normal. No respiratory distress.      Breath sounds: Normal breath sounds. No stridor. No wheezing, rhonchi or rales.   Skin:     General: Skin is warm.   Neurological:      Mental Status: She is alert.   Psychiatric:         Behavior: Behavior normal.          ASSESSMENT AND PLAN      1. Encounter for medication counseling (Primary)  2. Encounter for weight loss counseling  4. Class 1 obesity without serious comorbidity with body mass index (BMI) of 34.0 to 34.9 in adult, unspecified obesity type   This 33 year old female with a BMI of 33.47, is currently following with our weight management team. Patient has a history of elevated blood pressure and family history of heart disease. Weight management team had discussed starting phentermine  15 mg, but this may not be the optimal choice given the patient's cardiovascular risk factors.  Plan:  - Recommend against starting phentermine  due to elevated blood pressure, family history of heart disease, and potential to further increase  blood pressure. Patient is currently on levothyroxine, which could confound symptoms of phentermine .   - Suggest exploring alternative weight management options  - Encourage weight management team to review concerns or contact me for further discussion  - ECG, In Clinic    3. Family history of heart disease  - Maternal grandmother: Hypertension, heart disease requiring pacemaker  - ECG, In Clinic    5. Primary hypertension  2 elevated BP readings now in chart. Will give diagnosis of HTN. Weight loss may help. Previously encouraged limiting salt and reducing alcohol use. Discussed representing in 3 months for a BP check. Encouraged brining a home log.  If BP not controlled recommend considering a medication.       Vang Kraeger, MD        [1]   Patient Active Problem List  Diagnosis    History of herpes genitalis    Screened positive for possible risky alcohol use (AUDIT score of 7-15)    Class 1 obesity without serious comorbidity with body mass index (BMI) of 34.0 to 34.9 in adult, unspecified obesity type    Focal nodular hyperplasia of liver    Hypothyroid    History of anemia    Vapes nicotine containing substance    Alcohol use   [2]   Current Outpatient Medications:     levothyroxine (SYNTHROID) 50 MCG tablet, Take 1 tablet (50 mcg) by mouth., Disp: , Rfl:     phentermine  15 MG capsule, Take 1 capsule (15 mg) by mouth every morning., Disp: 30 capsule, Rfl: 0

## 2024-05-29 ENCOUNTER — Encounter (INDEPENDENT_AMBULATORY_CARE_PROVIDER_SITE_OTHER): Payer: Self-pay

## 2024-05-29 ENCOUNTER — Telehealth (INDEPENDENT_AMBULATORY_CARE_PROVIDER_SITE_OTHER): Admitting: Physician Assistant

## 2024-05-29 VITALS — Ht 64.0 in | Wt 195.0 lb

## 2024-05-29 MED ORDER — TIRZEPATIDE-WEIGHT MANAGEMENT 5 MG/0.5ML SC SOAJ
5.0000 mg | SUBCUTANEOUS | 1 refills | Status: DC
Start: 2024-05-29 — End: 2024-07-03

## 2024-05-29 MED ORDER — TIRZEPATIDE-WEIGHT MANAGEMENT 2.5 MG/0.5ML SC SOAJ
2.5000 mg | SUBCUTANEOUS | 1 refills | Status: DC
Start: 2024-05-29 — End: 2024-07-03

## 2024-05-29 NOTE — Progress Notes (Signed)
 VIDEO VISIT: Patient consented to this service being conducted by interactive realtime audio and video communication.      HPI  Autumn Vincent is a 33 year old female being seen virtually for follow-up at the [facility].  Patient's current BMI is Body mass index is 33.47 kg/m. , categorizing them as obese .  Patient has a goal of achieving a normal BMI [18.5-24.9] to improve quality of life and reduce obesity-related complications.  Patient currently has HTN co-morbidities.  Patient is currently adjusting food intake, exercise, and behavior modification as discussed to achieve goals.       Patient returns after starting phentermine  but then discontinued as advised once she reached out informing of family history of heart disease and has elevated BP.  Patient was seen in PCP yesterday for phentermine  clearance/EKG and due to two elevated blood pressures and family history of heart disease, phentermine  was NOT recommended per PCP so patient is here today to discuss alteratives.     FH maternal grandmother and great grandmother both with pacemakers and HTN, unsure of exact diagnosis.      Denies   History of gallstones or pancreatitis  Hypersensitivity to drug or ingredient  Pregnancy, planning to get pregnant, or breastfeeding, on OCP  Personal or family history of medullary thyroid cancer  Multiple endocrine neoplasia syndrome type 2  Suicidal ideation or history of suicidal ideation/attempt     Weight Loss Medication:   None currently     Current Food Intake:    Has been trying to eat healthy, was still hungry on phentermine  when she took for a few days    Current Exercise:   Amount and Type:     Recent weights [Today's weight self reported per patient]:  Wt Readings from Last 3 Encounters:   05/29/24 88.5 kg (195 lb)   05/28/24 88.5 kg (195 lb)   05/17/24 90.3 kg (199 lb)       Current BMI:  Body mass index is 33.47 kg/m.    Goal:   Normal BMI [18.5-24.9]      PAST MEDICAL  HISTORY:  Past Medical History[1]    CURRENT MEDICATIONS:   Current Medications[2]     ALLERGIES:   Patient has no known allergies.     FAMILY HISTORY:   family history includes Heart Disease in her m grandmother; Hypertension in her m grandmother; No Known Problems in her father, m grandfather, mother, p grandfather, and p grandmother.     SOCIAL HISTORY:    reports that she has been smoking electronic cigarette (smokeless). She has never used smokeless tobacco. She reports current alcohol use of about 4.0 standard drinks of alcohol per week. She reports that she does not use drugs.      ROS:  Pertinent positives and negatives noted in HPI, otherwise reviewed and negative.    VITALS:  [Today's height/weight self reported per patient]  Vitals:    05/29/24 0737   Weight: 88.5 kg (195 lb)   Height: 5' 4 (1.626 m)     Blood Pressure   05/28/24 (!) 130/94   05/16/24 122/90   02/22/19 112/64     Pulse Readings from Last 3 Encounters:   05/28/24 80   05/16/24 83   02/22/19 85     The ASCVD Risk score (Arnett DK, et al., 2019) failed to calculate for the following reasons:    The 2019 ASCVD risk score is only valid for ages 18 to 57  PHYSICAL EXAMINATION [exam limited due to virtual assessment]:  Physical Exam  General: Respiratory rate WNL, no acute distress, sitting comfortably  Head: Normocephalic, atraumatic  Eyes: EOMI, no eye injection, no eyelid swelling, no icterus  EENT: Mucous membranes moist, no visible lip cracking, no active nasal drainage  Neck: Supple, no visible goiter  Respiratory: No retractions, no nasal flaring, normal overall work of breathing  Cardio: No cyanosis  Skin: No visible rash or lesions, hair normal appearance   Psych: Normal affect, mood, and speech  Neuro: CN II-XII grossly intact      ASSESSMENT AND PLAN:  Autumn Vincent is a 33 year old female obese with HTN co-morbidities who would benefit from weight loss for quality of life and current/potential weight-related  co-morbidities, with a goal of achieving a normal BMI to improve quality of life and decrease obesity-related complications.  Continue currently lifestyle recommendations and medication management if indicated.     Wt Readings from Last 3 Encounters:   05/29/24 88.5 kg (195 lb)   05/28/24 88.5 kg (195 lb)   05/17/24 90.3 kg (199 lb)     Body mass index is 33.47 kg/m.    1. Primary hypertension  - Diagnosed yesterday per PCP after 2 elevated Bps. 130/94 yesterday in office.  - Recommended low salt, weight loss, lifestyle changes.   - Due to return in 3 months for BP recheck with PCP and will discuss possibility of medication at that time.   - Discussed implementing lifestyle modifications to improve condition, including healthy diet, regular exercise, and/or behavior modification.    - tirzepatide-weight management (ZEPBOUND) 2.5 MG/0.5ML injection pen; Inject 0.5 mL (2.5 mg) under the skin every 7 days.  Dispense: 2 mL; Refill: 1  - tirzepatide-weight management (ZEPBOUND) 5 MG/0.5ML injection pen; Inject 0.5 mL (5 mg) under the skin every 7 days.  Dispense: 2 mL; Refill: 1    2. Class 1 obesity without serious comorbidity with body mass index (BMI) of 33.0 to 33.9 in adult, unspecified obesity type  - PCP recommended AGAINST phentermine  as weight loss option due to new diagnosis of HTN and FH of cardiac pacemakers in her maternal grandmother and great grandmother.   - Good candidate for Zepbound.   - Counseled on MOA and side effects.   - Prescribed as below.   - Emphasized protein and strength training.   - Discussed implementing lifestyle modifications to improve condition, including healthy diet, regular exercise, and/or behavior modification.    - Follow-up 2-3 weeks after starting medication, appt made.   - tirzepatide-weight management (ZEPBOUND) 2.5 MG/0.5ML injection pen; Inject 0.5 mL (2.5 mg) under the skin every 7 days.  Dispense: 2 mL; Refill: 1  - tirzepatide-weight management (ZEPBOUND) 5 MG/0.5ML  injection pen; Inject 0.5 mL (5 mg) under the skin every 7 days.  Dispense: 2 mL; Refill: 1    3. Family history of cardiac pacemaker  - Maternal grandmother and great grandmother with pacemakers placed and h/o HTN.   - Unsure of exact heart diagnosis.   - PCP concerned about stimulant with FH and new diagnosis of HTN and recommended against.   - Did obtain EKG in office yesterday for patient that was reviewed per PCP.          FOLLOW UP  No follow-ups on file.        Nat Berlin, PA-C         [1]   Past Medical History:  Diagnosis Date    Anemia  complicating pregnancy, second trimester 02/13/2018    Anemia complicating pregnancy, second trimester 02/13/2018    BV (bacterial vaginosis) 10/03/2018    Genital herpes     Hypothyroidism     Primary hypertension 05/28/2024    UTI (urinary tract infection)    [2]   Current Outpatient Medications:     levothyroxine (SYNTHROID) 50 MCG tablet, Take 1 tablet (50 mcg) by mouth., Disp: , Rfl:     tirzepatide-weight management (ZEPBOUND) 2.5 MG/0.5ML injection pen, Inject 0.5 mL (2.5 mg) under the skin every 7 days., Disp: 2 mL, Rfl: 1    tirzepatide-weight management (ZEPBOUND) 5 MG/0.5ML injection pen, Inject 0.5 mL (5 mg) under the skin every 7 days., Disp: 2 mL, Rfl: 1

## 2024-06-01 ENCOUNTER — Ambulatory Visit (INDEPENDENT_AMBULATORY_CARE_PROVIDER_SITE_OTHER): Payer: Self-pay | Admitting: Physician Assistant

## 2024-06-01 ENCOUNTER — Ambulatory Visit (INDEPENDENT_AMBULATORY_CARE_PROVIDER_SITE_OTHER): Admitting: Physician Assistant

## 2024-06-01 VITALS — BP 111/89 | HR 71 | Temp 98.0°F | Resp 18 | Ht 64.0 in | Wt 196.2 lb

## 2024-06-01 LAB — UA, CHEM ONLY POCT
Bilirubin: NEGATIVE
Glucose: NEGATIVE
Ketones: NEGATIVE
Nitrite: NEGATIVE
Specific Gravity: 1.025 (ref 1.002–1.030)
Urobilinogen: 0.2 mg/dL (ref 0.2–1.0)
pH: 5 (ref 5.00–8.00)

## 2024-06-01 MED ORDER — NITROFURANTOIN MONOHYD MACRO 100 MG OR CAPS
100.0000 mg | ORAL_CAPSULE | Freq: Two times a day (BID) | ORAL | 0 refills | Status: AC
Start: 2024-06-01 — End: 2024-06-06

## 2024-06-01 NOTE — Interdisciplinary (Signed)
 PROCEDURE(S) PERFORMED DURING IN OFFICE VISIT    Procedure(s):   -POCT: URINALYSIS: Values inputted into patient chart.   -URINE COLLECTION FOR LAB TESTING, collection being sent to [facility]  Performed by Medical Assistant: Nanine Corners    Ordered and verified by Iantha Miyamoto PA-C  Additional Notes:   N/A      Most Recent Immunizations   Administered Date(s) Administered    Adenovirus Types 4 & 7 PO vaccine (live) 09/07/2012    COVID-19 Andree) >= 18 Years 02/20/2020    DTaP 10/30/1997    HPV-9 Vaccine (GARDASIL-9) 01/15/2019    Haemophilus Vaccine 01/25/1994    Hep-A Ped/Adol 2 Dose Schedule 03/18/2000    Hep-A Pediatric, Unspecified 03/18/2000    Hep-A/Hep-B; Twinrix, Adult 10/06/2012    Hepatitis B vaccine (adult 20 yrs and older) 12/18/2001    Hepatitis B vaccine (newborn-19 yrs) 12/18/2001    Inactivated Polio Vaccine (IPV) 09/07/2012    Influenza Vaccine (Unspecified) 06/25/2023    Influenza Vaccine >=6 Months 01/11/2023    MMR Live Vaccine 10/30/1997    Meningococcal Conjugate ACWY-Menactra 09/07/2012    Tdap 08/27/2014     Autumn Vincent had no medications administered during this visit.

## 2024-06-01 NOTE — Progress Notes (Signed)
 [facility] Office Visit    HPI   Autumn Vincent is a 33 year old female who presents to clinic for UTI Symptoms    Patient presents to office with complaints of urinary urgency, frequency and discomfort that started about 3 days ago. She endorses suprapubic pressure. Denies hematuria. No new sexual partners. No abnormal discharge, itching, abnormal bleeding, or pelvic pain. She denies abdominal/flank pain, N/V/D, fevers, chills or sweats. Feels like prior UTI episodes. Denies any OTC medications for relief. FDLMP: 05/21/24.     REVIEW OF SYSTEMS    Negative except for those in HPI     PAST MEDICAL AND SURGICAL HISTORY:    has a past surgical history that includes breast reduction (2017) and tummy tuck (2017).  Problem List[1]     CURRENT MEDICATIONS:  Medications Ordered Prior to Encounter[2]    ALLERGIES:  Patient has no known allergies.    FAMILY HISTORY:  family history includes Heart Disease in her m grandmother; Hypertension in her m grandmother; No Known Problems in her father, m grandfather, mother, p grandfather, and p grandmother.    SOCIAL HISTORY:     none     Socioeconomic History    Marital status: Married   Tobacco Use    Smoking status: Some Days     Types: Electronic Cigarette (Smokeless)    Smokeless tobacco: Never   Vaping Use    Vaping status: Every Day    Substances: Nicotine   Substance and Sexual Activity    Alcohol use: Yes     Alcohol/week: 4.0 standard drinks of alcohol     Types: 4 Shots of liquor per week    Drug use: No    Sexual activity: Not Currently     Partners: Male     Birth control/protection: Condom   Social History Narrative    Married    2 children        PHYSICAL EXAMINATION       06/01/24  0827   BP: 111/89   Pulse: 71   Temp: 98 F (36.7 C)   Resp: 18   SpO2: 99%     Body mass index is 33.68 kg/m.  Wt Readings from Last 5 Encounters:   06/01/24 89 kg (196 lb 3.2 oz)   05/29/24 88.5 kg (195 lb)   05/28/24 88.5 kg (195 lb)   05/17/24 90.3 kg (199 lb)   05/16/24  89.8 kg (198 lb)     Blood Pressure   06/01/24 111/89   05/28/24 (!) 130/94   05/16/24 122/90   02/22/19 112/64   01/25/19 91/68       General: in NAD, A&O, pleasant, non-toxic appearing  EENT: No injection or icterus. Vision/hearing grossly intact  Lungs: clear to auscultation bilaterally. No chest deformities noted.  No wheeze/rales/rhonchi   Heart: Regular rhythm and rate, No murmur, gallops, or rubs   Respiratory: Respirations are non-labored.  Neurologic: Mentation appropriate. No facial droop. No tremor.  Abdomen: Normoactive bowel sounds. Soft, non-tender, non-distended. No masses, organomegaly. No rebound tenderness. No CVAT    Psych: Alert and Oriented. Mood/affect appropriate.  Msk: Normal gait/station, ambulatory without assistance   Skin: exposed areas free of rash and no significant lesions     ASSESSMENT AND PLAN      Autumn Vincent is a 33 year old female who presents to clinic for UTI Symptoms    Diagnosis and plan for this encounter:     1. Acute cystitis without hematuria  Patient presenting with urinary discomfort, frequency, and urgency that onset 3 days ago.   Urinalysis in the office is positive for  WBC.    - UA, Chem Only (POCT)  History and urinalysis consistent with Urinary tract infection.   Urine culture ordered and sent. Will call patient with results.   - Urinalysis with Culture Reflex, when indicated  Empiric antibiotic treatment started: Macrobid BID x 5 days.    - nitrofurantoin monohydrate (MACROBID) 100 MG capsule; Take 1 capsule (100 mg) by mouth 2 times daily for 5 days.  Dispense: 10 capsule; Refill: 0    UTI prevention discussed: post-coital urination, hydration, emptying bladder. Supplements: D-mannose, and cranberry.   Supportive discussed: Increase fluids, AZO OTC  Follow up in the office if symptoms do not resolve or if patient has any worsening symptoms such as fevers, chills, sweats, flank pain. \    2. Urinary urgency  As discussed above   - UA, Chem Only  (POCT)  - Urinalysis with Culture Reflex, when indicated    3. Urinary frequency  As discussed above   - UA, Chem Only (POCT)  - Urinalysis with Culture Reflex, when indicated    FOLLOW UP   Schedule an office follow-up if symptoms fail to improve or persist.   Follow up for any acute concerns or questions as needed.     Strict follow up precautions given for fever, worsening symptoms or concerns.     Iantha Miyamoto, PA  [facility]          [1]   Patient Active Problem List  Diagnosis    History of herpes genitalis    Screened positive for possible risky alcohol use (AUDIT score of 7-15)    Class 1 obesity without serious comorbidity with body mass index (BMI) of 33.0 to 33.9 in adult, unspecified obesity type    Focal nodular hyperplasia of liver    Hypothyroid    History of anemia    Vapes nicotine containing substance    Alcohol use    Primary hypertension    Family history of cardiac pacemaker   [2]   Current Outpatient Medications on File Prior to Visit   Medication Sig Dispense Refill    levothyroxine (SYNTHROID) 50 MCG tablet Take 1 tablet (50 mcg) by mouth.      [DISCONTINUED] phentermine  15 MG capsule Take 1 capsule (15 mg) by mouth every morning. 30 capsule 0    tirzepatide-weight management (ZEPBOUND) 2.5 MG/0.5ML injection pen Inject 0.5 mL (2.5 mg) under the skin every 7 days. 2 mL 1    tirzepatide-weight management (ZEPBOUND) 5 MG/0.5ML injection pen Inject 0.5 mL (5 mg) under the skin every 7 days. 2 mL 1     No current facility-administered medications on file prior to visit.

## 2024-06-05 ENCOUNTER — Ambulatory Visit (INDEPENDENT_AMBULATORY_CARE_PROVIDER_SITE_OTHER): Payer: Self-pay | Admitting: Physician Assistant

## 2024-06-05 ENCOUNTER — Telehealth (INDEPENDENT_AMBULATORY_CARE_PROVIDER_SITE_OTHER): Payer: Self-pay | Admitting: Physician Assistant

## 2024-06-05 LAB — URINALYSIS MICROSCOPIC ONLY, RANDOM URINE
Casts: NONE SEEN /LPF
WBC: >30 /HPF — AB (ref 0–5)

## 2024-06-05 LAB — URINALYSIS WITH CULTURE REFLEX, WHEN INDICATED
Bilirubin: NEGATIVE
Glucose: NEGATIVE
Ketones: NEGATIVE
Nitrite: NEGATIVE
Specific Gravity: 1.028 (ref 1.005–1.030)
Urobilinogen,Semi-Qn: 1 mg/dL (ref 0.2–1.0)
pH: 5.5 (ref 5.0–7.5)

## 2024-06-05 LAB — URINE CULTURE, ROUTINE - LABCORP

## 2024-06-05 NOTE — Telephone Encounter (Signed)
 WILL NEED TO BE CALLED IN. UNABLE TO FIND PATIENT IN CMM.

## 2024-06-05 NOTE — Telephone Encounter (Signed)
 [facility] Medication Prior Authorization     Previous encounter created for same issue?  No    Who is calling?: Patient     What medication needs Prior Auth: Zepbound 2.5mg     Is this a new medication? Yes (do not create new encounter - use open encounter)    Has patient tried and failed any alternative mediations? Yes (do not create new encounter - use open encounter)     If yes, please state which medications and patients reaction. after starting phentermine  but then discontinued as advised once she reached out informing of family history of heart disease and has elevated BP. Patient was seen in PCP yesterday for phentermine  clearance/EKG and due to two elevated blood pressures and family history of heart disease, phentermine  was NOT recommended per PCP so patient is here today to discuss alteratives.     (Information needed below is found on patients insurance card or by calling pharmacy)  Member Insurance Name: [employer]  Member Medication ID #: [id number]  BIN #: [id number]  PCN #: [id number]  Rx Group #: [id number]     Is patient requesting an urgent request?: Yes (Marked high priority and patient informed that [facility] will make every effort to address within 24 business hours)    PLEASE ROUTE MESSAGE TO [facility] RX PRIOR AUTH POOL    Autumn Vincent

## 2024-06-07 ENCOUNTER — Telehealth (INDEPENDENT_AMBULATORY_CARE_PROVIDER_SITE_OTHER): Admitting: Physician Assistant

## 2024-06-11 ENCOUNTER — Encounter (INDEPENDENT_AMBULATORY_CARE_PROVIDER_SITE_OTHER): Payer: Self-pay | Admitting: Physician Assistant

## 2024-06-11 NOTE — Telephone Encounter (Signed)
 Reached out and spoke to patient. Advise PA for zepbound was submitted but advise patient was not being found. Patient will reach out to insurance to make sure everything is good on her end and will reach back to resubmit PA

## 2024-06-12 DIAGNOSIS — K8020 Calculus of gallbladder without cholecystitis without obstruction: Secondary | ICD-10-CM | POA: Insufficient documentation

## 2024-06-12 NOTE — Telephone Encounter (Signed)
 Duplicate. Closing TE, PA has been submitted to pt insurance.   Please refer to TE from 06/05/2024.    This SmartPhrase is intended for use of Prior Authorization department.      PA request received: Telephone encounter     Medication: Zepbound     Prescribed by: Janel Garre     Method of Submission:  EPA     Key number or Case Number: [id number]     Type of Review: Expedited Review (with in 72 hours)     PA Status: PA PENDING

## 2024-06-12 NOTE — Telephone Encounter (Signed)
 This SmartPhrase is intended for use of Prior Authorization department.     PA request received: Telephone encounter    Medication: Zepbound    Prescribed by: Janel Garre    Method of Submission:  EPA    Key number or Case Number: [id number]    Type of Review: Expedited Review (with in 72 hours)    PA Status: PA PENDING

## 2024-06-14 ENCOUNTER — Encounter (INDEPENDENT_AMBULATORY_CARE_PROVIDER_SITE_OTHER): Payer: Self-pay | Admitting: Hospital

## 2024-06-14 NOTE — Telephone Encounter (Signed)
 This SmartPhrase is intended for use of Prior Authorization department.     PA request received: Telephone encounter    Medication: Zepbound    Prescribed by: Janel Garre    Method of Submission:  EPA    Key number or Case Number: [id number]    Type of Review: Expedited Review (with in 72 hours)    PA Status: PA HAS BEEN APPROVED  CaseId:[id number];Status:Approved;Review Type:Prior Auth;Coverage Start Date:05/13/2024;Coverage End Date:06/13/2025   Pt has been notified via MyChart,  Thank you!

## 2024-06-20 NOTE — Telephone Encounter (Signed)
 Patient informed of approved Zepbound through mychart

## 2024-06-20 NOTE — Telephone Encounter (Signed)
 Patient informed of approval through mychart

## 2024-07-03 ENCOUNTER — Telehealth: Admitting: Physician Assistant

## 2024-07-03 VITALS — Ht 64.0 in | Wt 196.0 lb

## 2024-07-03 NOTE — Progress Notes (Signed)
 VIDEO VISIT: Patient consented to this service being conducted by interactive realtime audio and video communication.      HPI  Autumn Vincent is a 33 year old female being seen virtually for follow-up at the [facility].  Patient's current BMI is Body mass index is 33.64 kg/m. , categorizing them as obese .  Patient has a goal of achieving a normal BMI [18.5-24.9] to improve quality of life and reduce obesity-related complications.  Patient currently has HTN co-morbidities.  Patient is currently adjusting food intake, exercise, and behavior modification as discussed to achieve goals.       Has taken zepbound for 3 weeks now, felt full while on the cruise.  Has taken 3 injections, eating smaller portions.  Doing well, no nausea.  Unsure of weight lost, doesn't have scale.     Wasj ust diagnosed with gallstones, was not aware of gallstones previously.  Was looking at liver, did note some fatty liver changes.  No RUQ, never had a gallstone attack previously.     Recent Ultrasound to check on liver:   CONCLUSION:   1. Cholelithiasis without sonographic evidence of acute cholecystitis.   2. Mildly increased hepatic echogenicity diffusely, suggestive of mild fatty infiltration.   3. No visualized hepatic lesion on today's exam.   Dictated and Electronically Authenticated by: Alden Pastor, M.D. on 06/12/2024 09:20 AM Proofread by: Alden Pastor, M.D. on 06/12/2024 09:20 AM     Weight Loss Medication:   Zepbound     Current Food Intake:    Eating smaller portions, even while on the cruise    Current Exercise:   Amount and Type:     Recent weights [Today's weight self reported per patient]:  Wt Readings from Last 3 Encounters:   07/03/24 88.9 kg (196 lb)   06/01/24 89 kg (196 lb 3.2 oz)   05/29/24 88.5 kg (195 lb)       Current BMI:  Body mass index is 33.64 kg/m.    Goal:   Normal BMI [18.5-24.9]      PAST MEDICAL HISTORY:  Past Medical History[1]    CURRENT MEDICATIONS:   Current  Medications[2]     ALLERGIES:   Patient has no known allergies.     FAMILY HISTORY:   family history includes Heart Disease in her m grandmother; Hypertension in her m grandmother; No Known Problems in her father, m grandfather, mother, p grandfather, and p grandmother.     SOCIAL HISTORY:    reports that she has been smoking electronic cigarette (smokeless). She has never used smokeless tobacco. She reports current alcohol use of about 4.0 standard drinks of alcohol per week. She reports that she does not use drugs.      ROS:  Pertinent positives and negatives noted in HPI, otherwise reviewed and negative.    VITALS:  [Today's height/weight self reported per patient]  Vitals:    07/03/24 0858   Weight: 88.9 kg (196 lb)   Height: 5' 4 (1.626 m)     Blood Pressure   06/01/24 111/89   05/28/24 (!) 130/94   05/16/24 122/90     Pulse Readings from Last 3 Encounters:   06/01/24 71   05/28/24 80   05/16/24 83     The ASCVD Risk score (Arnett DK, et al., 2019) failed to calculate for the following reasons:    The 2019 ASCVD risk score is only valid for ages 43 to 48      PHYSICAL  EXAMINATION [exam limited due to virtual assessment]:  Physical Exam  General: Respiratory rate WNL, no acute distress, sitting comfortably  Head: Normocephalic, atraumatic  Eyes: EOMI, no eye injection, no eyelid swelling, no icterus  EENT: Mucous membranes moist, no visible lip cracking, no active nasal drainage  Neck: Supple, no visible goiter  Respiratory: No retractions, no nasal flaring, normal overall work of breathing  Cardio: No cyanosis  Skin: No visible rash or lesions, hair normal appearance   Psych: Normal affect, mood, and speech  Neuro: CN II-XII grossly intact      ASSESSMENT AND PLAN:  Autumn Vincent is a 33 year old female obese with no co-morbidities who would benefit from weight loss for quality of life and current/potential weight-related co-morbidities, with a goal of achieving a normal BMI to improve quality of life  and decrease obesity-related complications.  Continue currently lifestyle recommendations and medication management if indicated.     Wt Readings from Last 3 Encounters:   07/03/24 88.9 kg (196 lb)   06/01/24 89 kg (196 lb 3.2 oz)   05/29/24 88.5 kg (195 lb)     Body mass index is 33.64 kg/m.    1. Calculus of gallbladder without cholecystitis without obstruction  - Not a candidate for GLP1 with gallstones.   - Will follow-up with specialist to determine next steps before continuing with weight loss plan.   - Discontinued GLP1 today.   - Counseled on signs/symptoms and when to get checked out if any concern for cholecystitis.   - Briefly discussed possibility of gallbladder removal and circling back to GLP1.   - Counseled on avoiding tylenol, alcohol, and fatty diet.   Recent Ultrasound to check on liver:   CONCLUSION:   1. Cholelithiasis without sonographic evidence of acute cholecystitis.   2. Mildly increased hepatic echogenicity diffusely, suggestive of mild fatty infiltration.   3. No visualized hepatic lesion on today's exam.   Dictated and Electronically Authenticated by: Alden Pastor, M.D. on 06/12/2024 09:20 AM Proofread by: Alden Pastor, M.D. on 06/12/2024 09:20 AM     2. Alcohol use  - Could contribute to liver changes.     3. Primary hypertension  - Monitor BP.   - Managed per PCP.   - Recent diagnosis.   - Weight loss, low salt.     4. Class 1 obesity without serious comorbidity with body mass index (BMI) of 33.0 to 33.9 in adult, unspecified obesity type  - Discontinue GLP1 due to recent diagnosis of gallstones.   - Gave signs/symptoms of gallstones.   - Discussed fatty liver changes.   - Discussed Contrave as an option, counseled on MOA and side effects.   - Wants to address liver and gallbladder with upcoming follow-up and then circle back to discuss Contrave.   - Can only restart GLP1 if gallbladder is removed.   - Discussed implementing lifestyle modifications to improve condition, including  healthy diet, regular exercise, and/or behavior modification.          FOLLOW UP  No follow-ups on file.        Nat Berlin, PA-C         [1]   Past Medical History:  Diagnosis Date    Anemia complicating pregnancy, second trimester 02/13/2018    Anemia complicating pregnancy, second trimester 02/13/2018    BV (bacterial vaginosis) 10/03/2018    Genital herpes     Hypothyroidism     Primary hypertension 05/28/2024    UTI (urinary tract infection)    [  2]   Current Outpatient Medications:     levothyroxine (SYNTHROID) 50 MCG tablet, Take 1 tablet (50 mcg) by mouth., Disp: , Rfl:

## 2024-07-09 ENCOUNTER — Telehealth (INDEPENDENT_AMBULATORY_CARE_PROVIDER_SITE_OTHER): Payer: Self-pay | Admitting: Physician Assistant

## 2024-07-09 ENCOUNTER — Encounter (INDEPENDENT_AMBULATORY_CARE_PROVIDER_SITE_OTHER): Payer: Self-pay | Admitting: Physician Assistant

## 2024-07-09 NOTE — Telephone Encounter (Signed)
 [facility] Encounter Note    Previous Telephone/MyChart encounter created for same issue?  No    Please describe the action taken by yourself in detail: Pt called today a little confused with all the referrals she's been getting but nobody tells her before hand. She doesn't know why they sent her for a gallbladder surgery today and not even informing her of it she just received a call form the hospital. She would like a call back and get some explanation for the Dr.    This is just documentation in the patient chart, no further action is required.    My phone extension, if further information is needed: [phone]    Camie Mace

## 2024-07-09 NOTE — Telephone Encounter (Signed)
 Referrals placed by Dr. Kerstin Nolan not weight specialist provider PA Nat Berlin. Routing to PCP to assist

## 2024-07-10 ENCOUNTER — Telehealth: Admitting: Physician Assistant

## 2024-07-10 VITALS — Ht 64.0 in | Wt 185.0 lb

## 2024-07-10 MED ORDER — CONTRAVE 8-90 MG PO TB12
ORAL_TABLET | ORAL | 0 refills | Status: AC
Start: 2024-07-10 — End: 2024-08-07

## 2024-07-10 NOTE — Progress Notes (Signed)
 VIDEO VISIT: Patient consented to this service being conducted by interactive realtime audio and video communication.      HPI  Autumn Vincent is a 33 year old female being seen virtually for follow-up at the [facility].  Patient's current BMI is Body mass index is 31.76 kg/m. , categorizing them as obese .  Patient has a goal of achieving a normal BMI [18.5-24.9] to improve quality of life and reduce obesity-related complications.  Patient currently has elevated BP co-morbidities.  Patient is currently adjusting food intake, exercise, and behavior modification as discussed to achieve goals.       Patient was seen by general surgeon yesterday to review the gallstones incidentally found on recent ultrasound.      Patient wants to avoid phentermine  due to elevated BP, not currently taking medication, monitoring. Mother and grandmother had pacemakers.     Patient lost 13 lbs on Zepbound, was very successful.     Weight Loss Medication:   None currently     Current Food Intake:    Was eating less     Current Exercise:   Amount and Type:     Recent weights [Today's weight self reported per patient]:  Wt Readings from Last 3 Encounters:   07/10/24 83.9 kg (185 lb)   07/03/24 88.9 kg (196 lb)   06/01/24 89 kg (196 lb 3.2 oz)       Current BMI:  Body mass index is 31.76 kg/m.    Goal:   Normal BMI [18.5-24.9]      PAST MEDICAL HISTORY:  Past Medical History[1]    CURRENT MEDICATIONS:   Current Medications[2]     ALLERGIES:   Patient has no known allergies.     FAMILY HISTORY:   family history includes Heart Disease in her m grandmother; Hypertension in her m grandmother; No Known Problems in her father, m grandfather, mother, p grandfather, and p grandmother.     SOCIAL HISTORY:    reports that she has been smoking electronic cigarette (smokeless). She has never used smokeless tobacco. She reports current alcohol use of about 4.0 standard drinks of alcohol per week. She reports that  she does not use drugs.      ROS:  Pertinent positives and negatives noted in HPI, otherwise reviewed and negative.    VITALS:  [Today's height/weight self reported per patient]  Vitals:    07/10/24 1407   Weight: 83.9 kg (185 lb)   Height: 5' 4 (1.626 m)     Blood Pressure   06/01/24 111/89   05/28/24 (!) 130/94   05/16/24 122/90     Pulse Readings from Last 3 Encounters:   06/01/24 71   05/28/24 80   05/16/24 83     The ASCVD Risk score (Arnett DK, et al., 2019) failed to calculate for the following reasons:    The 2019 ASCVD risk score is only valid for ages 72 to 11      PHYSICAL EXAMINATION [exam limited due to virtual assessment]:  Physical Exam  General: Respiratory rate WNL, no acute distress, sitting comfortably  Head: Normocephalic, atraumatic  Eyes: EOMI, no eye injection, no eyelid swelling, no icterus  EENT: Mucous membranes moist, no visible lip cracking, no active nasal drainage  Neck: Supple, no visible goiter  Respiratory: No retractions, no nasal flaring, normal overall work of breathing  Cardio: No cyanosis  Skin: No visible rash or lesions, hair normal appearance   Psych: Normal affect, mood, and  speech  Neuro: CN II-XII grossly intact      ASSESSMENT AND PLAN:  Autumn Vincent is a 33 year old female obese with elevated BP co-morbidities who would benefit from weight loss for quality of life and current/potential weight-related co-morbidities, with a goal of achieving a normal BMI to improve quality of life and decrease obesity-related complications.  Continue currently lifestyle recommendations and medication management if indicated.     Wt Readings from Last 3 Encounters:   07/10/24 83.9 kg (185 lb)   07/03/24 88.9 kg (196 lb)   06/01/24 89 kg (196 lb 3.2 oz)     Body mass index is 31.76 kg/m.    1. Primary hypertension  - Monitor BP.   - Low salt.   - Regular exercise.   - Does not need medication, has been monitoring.     2. Calculus of gallbladder without cholecystitis without  obstruction  - Found incidentally.   - Hasn't ever had episode of RUQ pain, fatty stools, or jaundice.   - Did have consult with general surgeon but surgery was not recommended since patient was asymptomatic.   - Discussed GLP1s being contraindicated in the presence of gallstones.   - Knows what to look for in case of RUQ pain or attack.   - Naltrexone-buPROPion HCl ER (CONTRAVE) 8-90 MG TB12; Take 1 tablet by mouth every morning for 7 days, then 1 tablet twice a day for 7 days, then 2 tablets in the morning and 1 tablet in the evening for 7 days, then 2 tablets twice a day  Dispense: 70 tablet; Refill: 0    3. Class 1 obesity without serious comorbidity with body mass index (BMI) of 31.0 to 31.9 in adult, unspecified obesity type  - Was very successful on low dose GLP1 but only took for one month before discovering gallstones incidentally so had to stop.   - Counseled on options, will try Contrave.   - Monitor HR and BP.   - Counseled on MOA and side effects.   - Protein and strength training.   - Focus on smaller portions.   - Discussed implementing lifestyle modifications to improve condition, including healthy diet, regular exercise, and/or behavior modification.    - Naltrexone-buPROPion HCl ER (CONTRAVE) 8-90 MG TB12; Take 1 tablet by mouth every morning for 7 days, then 1 tablet twice a day for 7 days, then 2 tablets in the morning and 1 tablet in the evening for 7 days, then 2 tablets twice a day  Dispense: 70 tablet; Refill: 0        FOLLOW UP  No follow-ups on file.        Nat Berlin, PA-C           [1]   Past Medical History:  Diagnosis Date    Anemia complicating pregnancy, second trimester 02/13/2018    Anemia complicating pregnancy, second trimester 02/13/2018    BV (bacterial vaginosis) 10/03/2018    Genital herpes     Hypothyroidism     Primary hypertension 05/28/2024    UTI (urinary tract infection)    [2]   Current Outpatient Medications:     levothyroxine (SYNTHROID) 50 MCG tablet, Take 1 tablet  (50 mcg) by mouth., Disp: , Rfl:     Naltrexone-buPROPion HCl ER (CONTRAVE) 8-90 MG TB12, Take 1 tablet by mouth every morning for 7 days, then 1 tablet twice a day for 7 days, then 2 tablets in the morning and 1 tablet in the evening  for 7 days, then 2 tablets twice a day, Disp: 70 tablet, Rfl: 0

## 2024-07-10 NOTE — Telephone Encounter (Signed)
 Left message and sent pocketdoc message to assist with scheduling follow up with PA Nat Berlin

## 2024-07-11 ENCOUNTER — Telehealth (INDEPENDENT_AMBULATORY_CARE_PROVIDER_SITE_OTHER): Payer: Self-pay | Admitting: Physician Assistant

## 2024-07-11 NOTE — Telephone Encounter (Signed)
 [facility] Weight [facility] Medication Prior Authorization     Previous encounter created for same issue?  No    Who is calling?: Patient     What medication needs Prior Auth: Contrave 8-90mg     Is this a new medication? Yes (do not create new encounter - use open encounter)    Has patient tried and failed any alternative mediations? Yes (do not create new encounter - use open encounter)     If yes, please state which medications and patients reaction. to avoid phentermine  due to elevated BP, not currently taking medication, monitoring   lost 13 lbs on Zepbound, was very successful     Patient has attempted several trials of lifestyle interventions including food intake, exercise, and behavior changes in the past 6 months with minimal success.    Programs tried: n/A    Patient will be enrolled in [employer] [facility] [employer] Management program inclusive of medication management provided by a Board certified Obesity Specialist, Nutrition care by a registered dietician and counseling for physical activity and behavioral health.     -Current BMI: 31.76 kg/m , High BMI: 31.76 kg/m     (Information needed below is found on patients insurance card or by calling pharmacy)  Member Insurance Name: [employer]  Member Medication ID #: [id number]  BIN #: [id number]  PCN #: [id number]  Rx Group #: [id number]    Is patient requesting an urgent request?: Yes (Marked high priority and patient informed that [facility] will make every effort to address within 24 business hours)    PLEASE ROUTE MESSAGE TO [facility]IGHT [facility]NAG[facility] PRIOR AUTH    Akasia Ahmad

## 2024-07-11 NOTE — Telephone Encounter (Signed)
 This SmartPhrase is intended for use of Prior Authorization department.     PA request received: Telephone encounter    Medication: Contrave    Prescribed by: Writer,     Method of Submission:  EPA    Key number or Case Number: [id number]    Type of Review: Expedited Review (with in 72 hours)    PA Status: PA PENDING

## 2024-07-12 ENCOUNTER — Encounter (INDEPENDENT_AMBULATORY_CARE_PROVIDER_SITE_OTHER): Payer: Self-pay | Admitting: Hospital

## 2024-07-12 NOTE — Telephone Encounter (Signed)
 This SmartPhrase is intended for use of Prior Authorization department.     PA request received: Telephone encounter    Medication: Contrave    Prescribed by: Writer,     Method of Submission:  EPA    Key number or Case Number: [id number]    Type of Review: Expedited Review (with in 72 hours)    PA Status: PA HAS BEEN APPROVED  CaseId:[id number];Status:Approved;Review Type:Prior Auth;Coverage Start Date:06/11/2024;Coverage End Date:07/11/2025   Pt has been notified via MyChart, Thank you!

## 2024-07-25 ENCOUNTER — Encounter (INDEPENDENT_AMBULATORY_CARE_PROVIDER_SITE_OTHER): Payer: Self-pay | Admitting: Hospital

## 2024-08-01 NOTE — Telephone Encounter (Signed)
Patient informed through mychart.

## 2024-08-16 ENCOUNTER — Telehealth

## 2024-08-16 ENCOUNTER — Telehealth (INDEPENDENT_AMBULATORY_CARE_PROVIDER_SITE_OTHER): Payer: Self-pay

## 2024-08-16 ENCOUNTER — Encounter (INDEPENDENT_AMBULATORY_CARE_PROVIDER_SITE_OTHER): Payer: Self-pay

## 2024-08-16 NOTE — Telephone Encounter (Signed)
 A user error has taken place: encounter opened in error, closed for administrative reasons.

## 2024-08-16 NOTE — Addendum Note (Signed)
 Addended by: ETTA QUANT on: 08/16/2024 10:47 AM     Modules accepted: Orders

## 2024-08-16 NOTE — Telephone Encounter (Signed)
 Good Morning,   The patient had a referral sent out for imaging earlier today.  The imaging center has availability tomorrow for the x-ray.  Due to insurance rules, the imaging center cannot schedule sooner than 9 days.  The imaging center advised that the only way to expedite the appointment is if the referral is marked as urgent.  Patient called to request that the referral be updated to urgent so scheduling can occur sooner. Thankyou
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
## 2024-08-16 NOTE — Patient Instructions (Signed)
 We have ordered imaging for you today.      X-rays are no longer offered without an appointment. You should receive a text and/or email with a unique link to schedule your x-ray exam. For all other tests you will need to call to make an appointment.      Call [facility] at [phone] to schedule

## 2024-08-20 ENCOUNTER — Telehealth (INDEPENDENT_AMBULATORY_CARE_PROVIDER_SITE_OTHER): Payer: Self-pay | Admitting: Physician Assistant

## 2024-08-20 NOTE — Telephone Encounter (Signed)
 [facility] Encounter Note    Previous Telephone/MyChart encounter created for same issue?  No    Please describe the action taken by yourself in detail: Pt called to reschedule appt for a later time tomorrow    This is just documentation in the patient chart, no further action is required.    My phone extension, if further information is needed: [phone]    Laveda Penner

## 2024-08-21 ENCOUNTER — Telehealth: Admitting: Physician Assistant

## 2024-08-21 ENCOUNTER — Telehealth (INDEPENDENT_AMBULATORY_CARE_PROVIDER_SITE_OTHER): Admitting: Physician Assistant

## 2024-08-21 VITALS — Wt 182.0 lb

## 2024-08-21 MED ORDER — METFORMIN HCL 500 MG OR TABS: 500.0000 mg | ORAL_TABLET | Freq: Two times a day (BID) | ORAL | 3 refills | Status: AC

## 2024-08-21 NOTE — Progress Notes (Signed)
 VIDEO VISIT: Patient consented to this service being conducted by interactive realtime audio and video communication.      HPI  Autumn Vincent is a 33 year old female being seen virtually for follow-up at the [facility].  Patient's current BMI is Body mass index is 31.24 kg/m. , categorizing them as obese .  Patient has a goal of achieving a normal BMI [18.5-24.9] to improve quality of life and reduce obesity-related complications.  Patient currently has HTN co-morbidities.  Patient is currently adjusting food intake, exercise, and behavior modification as discussed to achieve goals.       Patient tried Contrave  and did ok but was late in her cycle for 6 days and believe it was due to the medication and then read it increases risk for clots so discontinued medication.      Patient was diagnosed incedentally with gallstones and had discussion about possibility of cholecystectomy with the surgeon but it was recommended that they avoid removing since patient had never had any issues.  Patient is going to research more about surgery but hesitant to do any unnecessary surgery.     Want to avoid phentermine  due to BP.  Zepbound  did previously work well for patient, prior to gallstone diagnosis.      Weight Loss Medication:   Contrave     Current Food Intake:        Current Exercise:   Amount and Type:     Recent weights [Today's weight self reported per patient]:  Wt Readings from Last 3 Encounters:   08/21/24 82.6 kg (182 lb)   07/10/24 83.9 kg (185 lb)   07/03/24 88.9 kg (196 lb)       Current BMI:  Body mass index is 31.24 kg/m.    Goal:   Normal BMI [18.5-24.9]      PAST MEDICAL HISTORY:  Past Medical History[1]    CURRENT MEDICATIONS:   Current Medications[2]     ALLERGIES:   Patient has no known allergies.     FAMILY HISTORY:   family history includes Heart Disease in her m grandmother; Hypertension in her m grandmother; No Known Problems in her father, m grandfather, mother, p  grandfather, and p grandmother.     SOCIAL HISTORY:    reports that she has been smoking electronic cigarette (smokeless). She has never used smokeless tobacco. She reports current alcohol use of about 4.0 standard drinks of alcohol per week. She reports that she does not use drugs.      ROS:  Pertinent positives and negatives noted in HPI, otherwise reviewed and negative.    VITALS:  [Today's height/weight self reported per patient]  Vitals:    08/21/24 1148   Weight: 82.6 kg (182 lb)     Blood Pressure   06/01/24 111/89   05/28/24 (!) 130/94   05/16/24 122/90     Pulse Readings from Last 3 Encounters:   06/01/24 71   05/28/24 80   05/16/24 83     The ASCVD Risk score (Arnett DK, et al., 2019) failed to calculate for the following reasons:    The 2019 ASCVD risk score is only valid for ages 48 to 16      PHYSICAL EXAMINATION [exam limited due to virtual assessment]:  Physical Exam  General: Respiratory rate WNL, no acute distress, sitting comfortably  Head: Normocephalic, atraumatic  Eyes: EOMI, no eye injection, no eyelid swelling, no icterus  EENT: Mucous membranes moist, no visible lip cracking, no  active nasal drainage  Neck: Supple, no visible goiter  Respiratory: No retractions, no nasal flaring, normal overall work of breathing  Cardio: No cyanosis  Skin: No visible rash or lesions, hair normal appearance   Psych: Normal affect, mood, and speech  Neuro: CN II-XII grossly intact      ASSESSMENT AND PLAN:  Autumn Vincent is a 33 year old female obese with HTN co-morbidities who would benefit from weight loss for quality of life and current/potential weight-related co-morbidities, with a goal of achieving a normal BMI to improve quality of life and decrease obesity-related complications.  Continue currently lifestyle recommendations and medication management if indicated.     Wt Readings from Last 3 Encounters:   08/21/24 82.6 kg (182 lb)   07/10/24 83.9 kg (185 lb)   07/03/24 88.9 kg (196 lb)     Body  mass index is 31.24 kg/m.    1. Primary hypertension  - Monitor, low salt, regular exercise.   - metFORMIN  (GLUCOPHAGE ) 500 mg tablet; Take 1 tablet (500 mg) by mouth 2 times daily (with meals). Start with 1 tab PO in am with meal x1-2 weeks and can increase to bid ac once tolerating  Dispense: 180 tablet; Refill: 3    2. Class 1 obesity without serious comorbidity with body mass index (BMI) of 31.0 to 31.9 in adult, unspecified obesity type  - Discussed options since GLP1 isn't an option due to gallstones.   - Will try metformin  as prescribed.   - Counseled on MOA and side effects.   - Protein and strength training.   - Has been able to maintain loss achieved on Zepbound .   - Follow-up in 4-6 weeks to recheck.   - Discussed implementing lifestyle modifications to improve condition, including healthy diet, regular exercise, and/or behavior modification.    - metFORMIN  (GLUCOPHAGE ) 500 mg tablet; Take 1 tablet (500 mg) by mouth 2 times daily (with meals). Start with 1 tab PO in am with meal x1-2 weeks and can increase to bid ac once tolerating  Dispense: 180 tablet; Refill: 3    3. Calculus of gallbladder without cholecystitis without obstruction  - Did not push surgery, as it is a big decision.   - Not a candidate for GLP1 with gallstones.   - Reviewed US  again with patient indicating gallstones, has never had an attack.   - Did review signs/symptoms of gallstones and how it can lead to issues if common bile duct blocked.   - Will research cholecystectomy and potentially discuss again with surgeon.             FOLLOW UP  No follow-ups on file.        Nat Berlin, PA-C         [1]   Past Medical History:  Diagnosis Date    Anemia complicating pregnancy, second trimester 02/13/2018    Anemia complicating pregnancy, second trimester 02/13/2018    BV (bacterial vaginosis) 10/03/2018    Genital herpes     Hypothyroidism     Primary hypertension 05/28/2024    UTI (urinary tract infection)    [2]   Current Outpatient  Medications:     levothyroxine (SYNTHROID) 50 MCG tablet, Take 1 tablet (50 mcg) by mouth., Disp: , Rfl:     metFORMIN  (GLUCOPHAGE ) 500 mg tablet, Take 1 tablet (500 mg) by mouth 2 times daily (with meals). Start with 1 tab PO in am with meal x1-2 weeks and can increase to bid ac once  tolerating, Disp: 180 tablet, Rfl: 3

## 2024-08-22 ENCOUNTER — Ambulatory Visit (INDEPENDENT_AMBULATORY_CARE_PROVIDER_SITE_OTHER): Payer: Self-pay

## 2024-09-18 ENCOUNTER — Encounter (INDEPENDENT_AMBULATORY_CARE_PROVIDER_SITE_OTHER): Payer: Self-pay

## 2024-09-18 ENCOUNTER — Other Ambulatory Visit (INDEPENDENT_AMBULATORY_CARE_PROVIDER_SITE_OTHER): Payer: Self-pay

## 2024-09-18 DIAGNOSIS — E039 Hypothyroidism, unspecified: Secondary | ICD-10-CM

## 2024-09-19 MED ORDER — LEVOTHYROXINE SODIUM 50 MCG OR TABS: 50.0000 ug | ORAL_TABLET | Freq: Every day | ORAL | 1 refills | Status: DC

## 2024-09-20 NOTE — Telephone Encounter (Signed)
 Comer,   Please print the script off epic and ask Dr. Scot to sign it. We no longer have script pads to write the prescription on.

## 2024-09-27 ENCOUNTER — Encounter (INDEPENDENT_AMBULATORY_CARE_PROVIDER_SITE_OTHER): Payer: Self-pay | Admitting: Family Medicine

## 2024-09-27 ENCOUNTER — Telehealth: Admitting: Physician Assistant

## 2024-09-27 VITALS — Ht 64.0 in | Wt 181.0 lb

## 2024-09-27 DIAGNOSIS — E039 Hypothyroidism, unspecified: Secondary | ICD-10-CM

## 2024-09-27 MED ORDER — LEVOTHYROXINE SODIUM 50 MCG OR TABS: 50.0000 ug | ORAL_TABLET | Freq: Every day | ORAL | 1 refills | Status: DC

## 2024-09-27 MED ORDER — TOPIRAMATE 25 MG OR TABS: 25.0000 mg | ORAL_TABLET | Freq: Two times a day (BID) | ORAL | 1 refills | Status: AC

## 2024-09-27 MED ORDER — LEVOTHYROXINE SODIUM 50 MCG OR TABS: 50.0000 ug | ORAL_TABLET | Freq: Every day | ORAL | 1 refills | Status: AC

## 2024-09-27 NOTE — Progress Notes (Signed)
 VIDEO VISIT: Patient consented to this service being conducted by interactive realtime audio and video communication.      HPI  Autumn Vincent is a 34 year old female being seen virtually for follow-up at the [facility].  Patient's current BMI is Body mass index is 31.07 kg/m. , categorizing them as obese .  Patient has a goal of achieving a normal BMI [18.5-24.9] to improve quality of life and reduce obesity-related complications.  Patient currently has no co-morbidities.  Patient is currently adjusting food intake, exercise, and behavior modification as discussed to achieve goals.       Patient started metformin  last month, did go to the restroom more often (2-3 times per day), only taking one pill a day, didn't move up to twice daily.  Noticed she has been craving sugar recently, which is not normal for her.  Weight hasn't changed. Doesn't feel like it is helping with appetite and even craving more sugar.  Last A1C 5.4%.      Has been maintaining 180 lbs since 06/2024, lost about 20 lbs while on the zepbound  until gallstones were incidentally found.     Goal: 165 lbs     Currently on metformin  once daily with a meal   Phentermine : wants to avoid due history of HTN  Contrave : wants to avoid because caused irregular menstrual cycles   GLP1: current gallstones, not a candidate     Weight Loss Medication:   Metformin  500mg  once daily     Current Food Intake:   Having some sugar cravings, has been intentional about protein but not tracking, protein drink in morning, chicken/steak/ground beef, drinking water    Current Exercise:   Amount and Type: More consistent exercise, yoga and gym to lift    Recent weights [Today's weight self reported per patient]:  Wt Readings from Last 3 Encounters:   09/27/24 82.1 kg (181 lb)   08/21/24 82.6 kg (182 lb)   07/10/24 83.9 kg (185 lb)       Current BMI:  Body mass index is 31.07 kg/m.    Goal:   Normal BMI [18.5-24.9]      PAST MEDICAL  HISTORY:  Past Medical History[1]    CURRENT MEDICATIONS:   Current Medications[2]     ALLERGIES:   Patient has no known allergies.     FAMILY HISTORY:   family history includes Heart Disease in her m grandmother; Hypertension in her m grandmother; No Known Problems in her father, m grandfather, mother, p grandfather, and p grandmother.     SOCIAL HISTORY:    reports that she has been smoking electronic cigarette (smokeless). She has never used smokeless tobacco. She reports current alcohol use of about 4.0 standard drinks of alcohol per week. She reports that she does not use drugs.      ROS:  Pertinent positives and negatives noted in HPI, otherwise reviewed and negative.    VITALS:  [Today's height/weight self reported per patient]  Vitals:    09/27/24 0857   Weight: 82.1 kg (181 lb)   Height: 5' 4 (1.626 m)     Blood Pressure   06/01/24 111/89   05/28/24 (!) 130/94   05/16/24 122/90     Pulse Readings from Last 3 Encounters:   06/01/24 71   05/28/24 80   05/16/24 83     The ASCVD Risk score (Arnett DK, et al., 2019) failed to calculate for the following reasons:    The 2019 ASCVD  risk score is only valid for ages 43 to 75      PHYSICAL EXAMINATION [exam limited due to virtual assessment]:  Physical Exam  General: Respiratory rate WNL, no acute distress, sitting comfortably  Head: Normocephalic, atraumatic  Eyes: EOMI, no eye injection, no eyelid swelling, no icterus  EENT: Mucous membranes moist, no visible lip cracking, no active nasal drainage  Neck: Supple, no visible goiter  Respiratory: No retractions, no nasal flaring, normal overall work of breathing  Cardio: No cyanosis  Skin: No visible rash or lesions, hair normal appearance   Psych: Normal affect, mood, and speech  Neuro: CN II-XII grossly intact      ASSESSMENT AND PLAN:  Autumn Vincent is a 34 year old female obese with no co-morbidities who would benefit from weight loss for quality of life and current/potential weight-related  co-morbidities, with a goal of achieving a normal BMI to improve quality of life and decrease obesity-related complications.  Continue currently lifestyle recommendations and medication management if indicated.     Wt Readings from Last 3 Encounters:   09/27/24 82.1 kg (181 lb)   08/21/24 82.6 kg (182 lb)   07/10/24 83.9 kg (185 lb)     Body mass index is 31.07 kg/m.    1. Class 1 obesity without serious comorbidity with body mass index (BMI) of 31.0 to 31.9 in adult, unspecified obesity type  - Stop metformin , causing frequent, loose stools and not helping with appetite.   - Has been committed to maintaining 20 lb weight loss achieve on zepbound , feels like she will be able to maintain and stay here but would like to get down to 165 lbs.   - Will try topiramate  as below.   - Counseled on MOA and side effects.   - Start nightly and then increase to bid.   - Keep food journal and protein log.   - Strength training.   - Discussed implementing lifestyle modifications to improve condition, including healthy diet, regular exercise, and/or behavior modification.    - topiramate  (TOPAMAX ) 25 MG tablet; Take 1 tablet (25 mg) by mouth 2 times daily. Start with 1 tab PO at night x1 week and if tolerating, increase to bid  Dispense: 60 tablet; Refill: 1    2. Calculus of gallbladder without cholecystitis without obstruction  - Not a candidate for GLP1.   - Incidentally found,     3. Primary hypertension  - Monitor BP.   - Low salt.   - Regular exercise.   - Weight loss will help.          FOLLOW UP  No follow-ups on file.        Nat Berlin, PA-C         [1]   Past Medical History:  Diagnosis Date    Anemia complicating pregnancy, second trimester 02/13/2018    Anemia complicating pregnancy, second trimester 02/13/2018    BV (bacterial vaginosis) 10/03/2018    Genital herpes     Hypothyroidism     Primary hypertension 05/28/2024    UTI (urinary tract infection)    [2]   Current Outpatient Medications:     levothyroxine   (SYNTHROID ) 50 MCG tablet, Take 1 tablet (50 mcg) by mouth every morning (before breakfast)., Disp: 90 tablet, Rfl: 1    metFORMIN  (GLUCOPHAGE ) 500 mg tablet, Take 1 tablet (500 mg) by mouth 2 times daily (with meals). Start with 1 tab PO in am with meal x1-2 weeks and can increase to bid  ac once tolerating, Disp: 180 tablet, Rfl: 3    topiramate  (TOPAMAX ) 25 MG tablet, Take 1 tablet (25 mg) by mouth 2 times daily. Start with 1 tab PO at night x1 week and if tolerating, increase to bid, Disp: 60 tablet, Rfl: 1

## 2024-11-01 ENCOUNTER — Telehealth (INDEPENDENT_AMBULATORY_CARE_PROVIDER_SITE_OTHER): Admitting: Physician Assistant
